# Patient Record
Sex: Female | Born: 1980
Health system: Southern US, Community
[De-identification: ages and names within clinical notes are randomized; demographics above are authoritative.]

## PROBLEM LIST (undated history)

## (undated) DIAGNOSIS — J3089 Other allergic rhinitis: Secondary | ICD-10-CM

## (undated) DIAGNOSIS — Q760 Spina bifida occulta: Secondary | ICD-10-CM

## (undated) DIAGNOSIS — T7840XA Allergy, unspecified, initial encounter: Secondary | ICD-10-CM

## (undated) DIAGNOSIS — Z8619 Personal history of other infectious and parasitic diseases: Secondary | ICD-10-CM

## (undated) DIAGNOSIS — J302 Other seasonal allergic rhinitis: Secondary | ICD-10-CM

## (undated) HISTORY — DX: Other allergic rhinitis: J30.89

## (undated) HISTORY — DX: Spina bifida occulta: Q76.0

## (undated) HISTORY — DX: Personal history of other infectious and parasitic diseases: Z86.19

## (undated) HISTORY — DX: Allergy, unspecified, initial encounter: T78.40XA

## (undated) HISTORY — DX: Other seasonal allergic rhinitis: J30.2

---

## 2000-09-16 HISTORY — PX: SPINAL FIXATION SURGERY: SHX1055

## 2006-07-31 ENCOUNTER — Other Ambulatory Visit: Admission: RE | Admit: 2006-07-31 | Discharge: 2006-07-31 | Payer: Self-pay | Admitting: Obstetrics and Gynecology

## 2008-06-04 ENCOUNTER — Ambulatory Visit (HOSPITAL_COMMUNITY): Admission: RE | Admit: 2008-06-04 | Discharge: 2008-06-04 | Payer: Self-pay | Admitting: Neurology

## 2008-06-15 ENCOUNTER — Ambulatory Visit (HOSPITAL_COMMUNITY): Admission: RE | Admit: 2008-06-15 | Discharge: 2008-06-15 | Payer: Self-pay | Admitting: Neurology

## 2010-10-08 ENCOUNTER — Encounter: Payer: Self-pay | Admitting: Neurology

## 2011-02-14 ENCOUNTER — Other Ambulatory Visit (HOSPITAL_COMMUNITY): Payer: Self-pay | Admitting: Internal Medicine

## 2011-02-14 DIAGNOSIS — R55 Syncope and collapse: Secondary | ICD-10-CM

## 2011-02-28 ENCOUNTER — Ambulatory Visit (HOSPITAL_COMMUNITY): Payer: Self-pay

## 2011-02-28 ENCOUNTER — Encounter (HOSPITAL_COMMUNITY)
Admission: RE | Admit: 2011-02-28 | Discharge: 2011-02-28 | Disposition: A | Discharge status: Home / Self Care | Payer: 59 | Source: Ambulatory Visit | Attending: Internal Medicine | Admitting: Internal Medicine

## 2011-02-28 DIAGNOSIS — R55 Syncope and collapse: Secondary | ICD-10-CM | POA: Insufficient documentation

## 2011-02-28 MED ORDER — TECHNETIUM TC 99M TETROFOSMIN IV KIT
10.0000 | PACK | Freq: Once | INTRAVENOUS | Status: AC | PRN
  Administered 2011-02-28: 10 via INTRAVENOUS

## 2011-02-28 MED ORDER — TECHNETIUM TC 99M TETROFOSMIN IV KIT
30.0000 | PACK | Freq: Once | INTRAVENOUS | Status: AC | PRN
  Administered 2011-02-28: 30 via INTRAVENOUS

## 2011-03-01 ENCOUNTER — Other Ambulatory Visit (HOSPITAL_COMMUNITY): Payer: Self-pay | Admitting: Family Medicine

## 2011-03-01 DIAGNOSIS — R55 Syncope and collapse: Secondary | ICD-10-CM

## 2011-03-05 ENCOUNTER — Inpatient Hospital Stay (HOSPITAL_COMMUNITY): Admission: RE | Admit: 2011-03-05 | Payer: Commercial Managed Care - PPO | Source: Ambulatory Visit

## 2011-03-05 ENCOUNTER — Other Ambulatory Visit (HOSPITAL_COMMUNITY): Payer: Commercial Managed Care - PPO

## 2011-03-05 ENCOUNTER — Ambulatory Visit (HOSPITAL_COMMUNITY)
Admission: RE | Admit: 2011-03-05 | Discharge: 2011-03-05 | Disposition: A | Discharge status: Home / Self Care | Payer: 59 | Source: Ambulatory Visit | Attending: Family Medicine | Admitting: Family Medicine

## 2011-03-05 DIAGNOSIS — R55 Syncope and collapse: Secondary | ICD-10-CM

## 2011-03-05 DIAGNOSIS — R079 Chest pain, unspecified: Secondary | ICD-10-CM | POA: Insufficient documentation

## 2011-03-05 MED ORDER — GADOBENATE DIMEGLUMINE 529 MG/ML IV SOLN
15.0000 mL | Freq: Once | INTRAVENOUS | Status: AC
  Administered 2011-03-05: 15 mL via INTRAVENOUS

## 2011-11-13 ENCOUNTER — Other Ambulatory Visit: Payer: 59

## 2011-11-13 ENCOUNTER — Encounter: Payer: Self-pay | Admitting: Internal Medicine

## 2011-11-13 ENCOUNTER — Ambulatory Visit (INDEPENDENT_AMBULATORY_CARE_PROVIDER_SITE_OTHER): Payer: 59 | Admitting: Internal Medicine

## 2011-11-13 DIAGNOSIS — J302 Other seasonal allergic rhinitis: Secondary | ICD-10-CM

## 2011-11-13 DIAGNOSIS — L509 Urticaria, unspecified: Secondary | ICD-10-CM

## 2011-11-13 DIAGNOSIS — J309 Allergic rhinitis, unspecified: Secondary | ICD-10-CM

## 2011-11-13 MED ORDER — EPINEPHRINE 0.3 MG/0.3ML IJ DEVI: 0.3000 mg | Freq: Once | INTRAMUSCULAR | Status: AC

## 2011-11-13 NOTE — Patient Instructions (Addendum)
Order- lab- food allergy profile, seafood panel    Done      Claritin - now, done  Script Auvi-Q 0.3 mg epinephrine injector  Daily antihistamine- Zyrtec, Allegra or Claritin  Benadryl as needed  Add an antihistamine based acid blocker for broader coverage-  Pepcid, Zantac or Tagamet  Foods to avoid or watch closely- shellfish, green pea, peanut, soybean/ soy products

## 2011-11-13 NOTE — Progress Notes (Signed)
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 7 or 8 years ago she does hold a cat and developed hives on her face. On February 11 she was eating sushi. During dinner she developed urticaria on her face treated with Benadryl. Hives resolved overnight. 3 days later she leaned to her face into some lilies given for her birthday and her cheeks swoll. History of perennial rhinitis with sneezing nasal drainage and I eructation. Allergy vaccine helped years ago while living in [place]. She usually takes Zyrtec every day, and occasional Flonase. Sometimes notices chest tightness while running but never diagnosed with asthma. Strong odors make tight in her chest. Pressure of elastic bands on clothing causes local hives and itching. No history of unusual reaction to insect stings or aspirin. She has 2 dogs. Married. Mother has asthma.  Allergy skin test: Strong reactions particularly to grass pollens, tree pollens, cat. Moderate to mild reaction to shellfish, green pea, peanut, soybean.  ROS-see HPI Constitutional:   No-   weight loss, night sweats, fevers, chills, fatigue, lassitude. HEENT:   No-  headaches, difficulty swallowing, tooth/dental problems, sore throat,       +  sneezing, itching, ear ache, nasal congestion, post nasal drip,  CV:  No-   chest pain, orthopnea, PND, swelling in lower extremities, anasarca,  dizziness, palpitations Resp: No-   shortness of breath with exertion or at rest.              No-   productive cough,  No non-productive cough,  No- coughing up of blood.              No-   change in color of mucus.  No- wheezing.   Skin: No-   rash or lesions. GI:  No-   heartburn, indigestion,  GU:  MS:  No-   joint pain or swelling.    No- back pain. Neuro-     nothing unusual Psych:  No- change in mood or affect. No depression or anxiety.  No memory loss.  OBJ- Physical Exam General- Alert, Oriented, Affect-appropriate, Distress-  none acute Skin- rash-none, lesions- none, excoriation- none Lymphadenopathy- none Head- atraumatic            Eyes- Gross vision intact, PERRLA, conjunctivae and secretions clear            Ears- Hearing, canals-normal            Nose- mild turbinate edema/ mucus, no-Septal dev,  polyps, erosion, perforation             Throat- Mallampati II , mucosa clear , drainage- none, tonsils- 3+ Neck- flexible , trachea midline, no stridor , thyroid nl, carotid no bruit Chest - symmetrical excursion , unlabored           Heart/CV- RRR , no murmur , no gallop  , no rub, nl s1 s2                           - JVD- none , edema- none,            Lung- clear to P&A, wheeze- none, cough- none , dullness-none, rub- none           Chest wall-  Abd- tender-no, distended-no, bowel sounds-present, HSM- no Br/ Gen/ Rectal- Not done, not indicated Extrem- cyanosis- none, clubbing, none, atrophy- none, strength- nl. Surgical scar lumbar spine Neuro- grossly intact to observation

## 2011-11-14 LAB — ALLERGEN FOOD PROFILE SPECIFIC IGE
Fish Cod: <0.1 kU/L (ref <0.35)
IgE (Immunoglobulin E), Serum: 72.2 IU/mL (ref 0.0–180.0)
Milk IgE: 0.11 kU/L (ref <0.35)
Peanut IgE: <0.1 kU/L (ref <0.35)
Soybean IgE: <0.1 kU/L (ref <0.35)
Tuna IgE: <0.1 kU/L (ref <0.35)
Wheat IgE: 0.39 kU/L — ABNORMAL HIGH (ref <0.35)

## 2011-11-15 LAB — ALLERGEN SEAFOOD PANEL
Scallop IgE: <0.1 kU/L (ref <0.35)
Tuna IgE: <0.1 kU/L (ref <0.35)

## 2011-11-17 ENCOUNTER — Encounter: Payer: Self-pay | Admitting: Internal Medicine

## 2011-11-17 DIAGNOSIS — J302 Other seasonal allergic rhinitis: Secondary | ICD-10-CM

## 2011-11-17 DIAGNOSIS — L509 Urticaria, unspecified: Secondary | ICD-10-CM | POA: Insufficient documentation

## 2011-11-17 HISTORY — DX: Other seasonal allergic rhinitis: J30.2

## 2011-11-17 NOTE — Assessment & Plan Note (Signed)
Antihistamines have been sufficient. We can add a nasal steroid spray if necessary. She is not looking for allergy vaccine at this time.

## 2011-11-17 NOTE — Assessment & Plan Note (Signed)
She understands to avoid foods that definite cause symptoms and to watch closely for the foods on this list. We discussed the Friday of triggers associated with urticaria and the lack of specificity. She will do well to continue maintenance antihistamine as discussed, using type I and type II agents.

## 2011-11-25 ENCOUNTER — Encounter: Payer: Self-pay | Admitting: Internal Medicine

## 2011-12-19 NOTE — Progress Notes (Signed)
Quick Note:  LMTCB ______ 

## 2012-05-17 LAB — HM PAP SMEAR: HM Pap smear: NORMAL

## 2013-03-09 ENCOUNTER — Ambulatory Visit (INDEPENDENT_AMBULATORY_CARE_PROVIDER_SITE_OTHER): Payer: 59 | Admitting: Internal Medicine

## 2013-03-09 ENCOUNTER — Encounter: Payer: Self-pay | Admitting: Internal Medicine

## 2013-03-09 ENCOUNTER — Other Ambulatory Visit (INDEPENDENT_AMBULATORY_CARE_PROVIDER_SITE_OTHER): Payer: 59

## 2013-03-09 VITALS — BP 100/62 | HR 60 | Temp 98.5°F | Ht 61.75 in | Wt 137.0 lb

## 2013-03-09 DIAGNOSIS — Z0000 Encounter for general adult medical examination without abnormal findings: Secondary | ICD-10-CM

## 2013-03-09 LAB — BASIC METABOLIC PANEL
BUN: 9 mg/dL (ref 6–23)
Calcium: 8.9 mg/dL (ref 8.4–10.5)
GFR: 134.19 mL/min (ref >60.00)
Potassium: 3.9 mEq/L (ref 3.5–5.1)
Sodium: 138 mEq/L (ref 135–145)

## 2013-03-09 LAB — CBC WITH DIFFERENTIAL/PLATELET
Basophils Absolute: 0 10*3/uL (ref 0.0–0.1)
Eosinophils Relative: 1.3 % (ref 0.0–5.0)
HCT: 41.4 % (ref 36.0–46.0)
Lymphocytes Relative: 24.6 % (ref 12.0–46.0)
Lymphs Abs: 2.2 10*3/uL (ref 0.7–4.0)
Monocytes Relative: 7.2 % (ref 3.0–12.0)
Platelets: 300 10*3/uL (ref 150.0–400.0)
RDW: 12.1 % (ref 11.5–14.6)
WBC: 8.8 10*3/uL (ref 4.5–10.5)

## 2013-03-09 LAB — URINALYSIS, ROUTINE W REFLEX MICROSCOPIC
Bilirubin Urine: NEGATIVE
Leukocytes, UA: NEGATIVE
Nitrite: NEGATIVE
RBC / HPF: NONE SEEN (ref 0–?)
Specific Gravity, Urine: 1.015 (ref 1.000–1.030)
Total Protein, Urine: NEGATIVE
WBC, UA: NONE SEEN (ref 0–?)
pH: 6.5 (ref 5.0–8.0)

## 2013-03-09 LAB — LIPID PANEL
Cholesterol: 197 mg/dL (ref 0–200)
HDL: 58.2 mg/dL (ref >39.00)
VLDL: 18.2 mg/dL (ref 0.0–40.0)

## 2013-03-09 LAB — HEPATIC FUNCTION PANEL
ALT: 12 U/L (ref 0–35)
AST: 15 U/L (ref 0–37)
Bilirubin, Direct: 0.1 mg/dL (ref 0.0–0.3)
Total Protein: 7.7 g/dL (ref 6.0–8.3)

## 2013-03-09 LAB — TSH: TSH: 2.43 u[IU]/mL (ref 0.35–5.50)

## 2013-03-09 MED ORDER — FOLIC ACID 800 MCG PO TABS: 400.0000 ug | ORAL_TABLET | Freq: Every day | ORAL | Status: DC

## 2013-03-09 NOTE — Patient Instructions (Signed)
It was good to see you today. We have reviewed your prior records including labs and tests today Health Maintenance reviewed - all recommended immunizations and age-appropriate screenings are up-to-date. Medications reviewed and updated, no changes recommended at this time. Test(s) ordered today. Your results will be released to MyChart (or called to you) after review, usually within 72hours after test completion. If any changes need to be made, you will be notified at that same time. Please schedule followup in 1-2 years, call sooner if problems.  Health Maintenance, Females A healthy lifestyle and preventative care can promote health and wellness.  Maintain regular health, dental, and eye exams.  Eat a healthy diet. Foods like vegetables, fruits, whole grains, low-fat dairy products, and lean protein foods contain the nutrients you need without too many calories. Decrease your intake of foods high in solid fats, added sugars, and salt. Get information about a proper diet from your caregiver, if necessary.  Regular physical exercise is one of the most important things you can do for your health. Most adults should get at least 150 minutes of moderate-intensity exercise (any activity that increases your heart rate and causes you to sweat) each week. In addition, most adults need muscle-strengthening exercises on 2 or more days a week.   Maintain a healthy weight. The body mass index (BMI) is a screening tool to identify possible weight problems. It provides an estimate of body fat based on height and weight. Your caregiver can help determine your BMI, and can help you achieve or maintain a healthy weight. For adults 20 years and older:  A BMI below 18.5 is considered underweight.  A BMI of 18.5 to 24.9 is normal.  A BMI of 25 to 29.9 is considered overweight.  A BMI of 30 and above is considered obese.  Maintain normal blood lipids and cholesterol by exercising and minimizing your intake  of saturated fat. Eat a balanced diet with plenty of fruits and vegetables. Blood tests for lipids and cholesterol should begin at age 28 and be repeated every 5 years. If your lipid or cholesterol levels are high, you are over 50, or you are a high risk for heart disease, you may need your cholesterol levels checked more frequently.Ongoing high lipid and cholesterol levels should be treated with medicines if diet and exercise are not effective.  If you smoke, find out from your caregiver how to quit. If you do not use tobacco, do not start.  If you are pregnant, do not drink alcohol. If you are breastfeeding, be very cautious about drinking alcohol. If you are not pregnant and choose to drink alcohol, do not exceed 1 drink per day. One drink is considered to be 12 ounces (355 mL) of beer, 5 ounces (148 mL) of wine, or 1.5 ounces (44 mL) of liquor.  Avoid use of street drugs. Do not share needles with anyone. Ask for help if you need support or instructions about stopping the use of drugs.  High blood pressure causes heart disease and increases the risk of stroke. Blood pressure should be checked at least every 1 to 2 years. Ongoing high blood pressure should be treated with medicines, if weight loss and exercise are not effective.  If you are 43 to 34 years old, ask your caregiver if you should take aspirin to prevent strokes.  Diabetes screening involves taking a blood sample to check your fasting blood sugar level. This should be done once every 3 years, after age 63, if you are  within normal weight and without risk factors for diabetes. Testing should be considered at a younger age or be carried out more frequently if you are overweight and have at least 1 risk factor for diabetes.  Breast cancer screening is essential preventative care for women. You should practice "breast self-awareness." This means understanding the normal appearance and feel of your breasts and may include breast  self-examination. Any changes detected, no matter how small, should be reported to a caregiver. Women in their 50s and 30s should have a clinical breast exam (CBE) by a caregiver as part of a regular health exam every 1 to 3 years. After age 46, women should have a CBE every year. Starting at age 39, women should consider having a mammogram (breast X-ray) every year. Women who have a family history of breast cancer should talk to their caregiver about genetic screening. Women at a high risk of breast cancer should talk to their caregiver about having an MRI and a mammogram every year.  The Pap test is a screening test for cervical cancer. Women should have a Pap test starting at age 73. Between ages 13 and 17, Pap tests should be repeated every 2 years. Beginning at age 22, you should have a Pap test every 3 years as long as the past 3 Pap tests have been normal. If you had a hysterectomy for a problem that was not cancer or a condition that could lead to cancer, then you no longer need Pap tests. If you are between ages 48 and 51, and you have had normal Pap tests going back 10 years, you no longer need Pap tests. If you have had past treatment for cervical cancer or a condition that could lead to cancer, you need Pap tests and screening for cancer for at least 20 years after your treatment. If Pap tests have been discontinued, risk factors (such as a new sexual partner) need to be reassessed to determine if screening should be resumed. Some women have medical problems that increase the chance of getting cervical cancer. In these cases, your caregiver may recommend more frequent screening and Pap tests.  The human papillomavirus (HPV) test is an additional test that may be used for cervical cancer screening. The HPV test looks for the virus that can cause the cell changes on the cervix. The cells collected during the Pap test can be tested for HPV. The HPV test could be used to screen women aged 84 years and  older, and should be used in women of any age who have unclear Pap test results. After the age of 22, women should have HPV testing at the same frequency as a Pap test.  Colorectal cancer can be detected and often prevented. Most routine colorectal cancer screening begins at the age of 62 and continues through age 73. However, your caregiver may recommend screening at an earlier age if you have risk factors for colon cancer. On a yearly basis, your caregiver may provide home test kits to check for hidden blood in the stool. Use of a small camera at the end of a tube, to directly examine the colon (sigmoidoscopy or colonoscopy), can detect the earliest forms of colorectal cancer. Talk to your caregiver about this at age 10, when routine screening begins. Direct examination of the colon should be repeated every 5 to 10 years through age 47, unless early forms of pre-cancerous polyps or small growths are found.  Hepatitis C blood testing is recommended for all people born  from 1945 through 1965 and any individual with known risks for hepatitis C.  Practice safe sex. Use condoms and avoid high-risk sexual practices to reduce the spread of sexually transmitted infections (STIs). Sexually active women aged 95 and younger should be checked for Chlamydia, which is a common sexually transmitted infection. Older women with new or multiple partners should also be tested for Chlamydia. Testing for other STIs is recommended if you are sexually active and at increased risk.  Osteoporosis is a disease in which the bones lose minerals and strength with aging. This can result in serious bone fractures. The risk of osteoporosis can be identified using a bone density scan. Women ages 27 and over and women at risk for fractures or osteoporosis should discuss screening with their caregivers. Ask your caregiver whether you should be taking a calcium supplement or vitamin D to reduce the rate of osteoporosis.  Menopause can be  associated with physical symptoms and risks. Hormone replacement therapy is available to decrease symptoms and risks. You should talk to your caregiver about whether hormone replacement therapy is right for you.  Use sunscreen with a sun protection factor (SPF) of 30 or greater. Apply sunscreen liberally and repeatedly throughout the day. You should seek shade when your shadow is shorter than you. Protect yourself by wearing long sleeves, pants, a wide-brimmed hat, and sunglasses year round, whenever you are outdoors.  Notify your caregiver of new moles or changes in moles, especially if there is a change in shape or color. Also notify your caregiver if a mole is larger than the size of a pencil eraser.  Stay current with your immunizations. Document Released: 03/18/2011 Document Revised: 11/25/2011 Document Reviewed: 03/18/2011 [facility] Patient Information 2014 [place], [place].

## 2013-03-09 NOTE — Progress Notes (Signed)
  Subjective:    Patient ID: Breanna Gonzalez, female    DOB: February 20, 1981, 32 y.o.   MRN: [id number]  HPI  patient is here today for annual physical. Patient feels well and has no complaints.  Past Medical History  Diagnosis Date  . Spina bifida occulta   . ALLERGIC RHINITIS   . Urticaria   . Perennial allergic rhinitis with seasonal variation 11/17/2011    Allergy skin testing positive 11/13/11   . History of chicken pox    Family History  Problem Relation Age of Onset  . Diabetes Mother   . Hypertension Mother   . Diabetes Father   . Asthma Mother     undx  . Diabetes Maternal Grandmother   . Depression Mother    History  Substance Use Topics  . Smoking status: Never Smoker   . Smokeless tobacco: Not on file  . Alcohol Use: Yes     Comment: occasional     Review of Systems Constitutional: Negative for fever or weight change.  Respiratory: Negative for cough and shortness of breath.   Cardiovascular: Negative for chest pain or palpitations.  Gastrointestinal: Negative for abdominal pain, no bowel changes.  Musculoskeletal: Negative for gait problem or joint swelling.  Skin: Negative for rash.  Neurological: Negative for dizziness or headache.  No other specific complaints in a complete review of systems (except as listed in HPI above).     Objective:   Physical Exam BP 100/62  Pulse 60  Temp(Src) 98.5 F (36.9 C) (Oral)  Ht 5' 1.75" (1.568 m)  Wt 137 lb (62.143 kg)  BMI 25.28 kg/m2  SpO2 97% Wt Readings from Last 3 Encounters:  03/09/13 137 lb (62.143 kg)  11/13/11 137 lb (62.143 kg)   Constitutional: She appears well-developed and well-nourished. No distress.  HENT: Head: Normocephalic and atraumatic. Ears: B TMs ok, no erythema or effusion; Nose: Nose normal. Mouth/Throat: Oropharynx is clear and moist. No oropharyngeal exudate.  Eyes: Conjunctivae and EOM are normal. Pupils are equal, round, and reactive to light. No scleral icterus.  Neck: Normal range of  motion. Neck supple. No JVD present. No thyromegaly present.  Cardiovascular: Normal rate, regular rhythm and normal heart sounds.  No murmur heard. No BLE edema. Pulmonary/Chest: Effort normal and breath sounds normal. No respiratory distress. She has no wheezes.  Abdominal: Soft. Bowel sounds are normal. She exhibits no distension. There is no tenderness. no masses Musculoskeletal: Normal range of motion, no joint effusions. No gross deformities Neurological: She is alert and oriented to person, place, and time. No cranial nerve deficit. Coordination, balance, strength, speech and gait are normal.  Skin: "hairy" changes at low back at site of spina bifida repair/surgical scar -Skin is warm and dry. No rash noted. No erythema.  Psychiatric: She has a normal mood and affect. Her behavior is normal. Judgment and thought content normal.   No results found for this basename: WBC, HGB, HCT, PLT, GLUCOSE, CHOL, TRIG, HDL, LDLDIRECT, LDLCALC, ALT, AST, NA, K, CL, CREATININE, BUN, CO2, TSH, PSA, INR, GLUF, HGBA1C, MICROALBUR       Assessment & Plan:   CPX/v70.0 - Patient has been counseled on age-appropriate routine health concerns for screening and prevention. These are reviewed and up-to-date. Immunizations are up-to-date or declined. Labs and reviewed.

## 2013-07-22 ENCOUNTER — Other Ambulatory Visit: Payer: Self-pay

## 2015-12-14 DIAGNOSIS — Z6825 Body mass index (BMI) 25.0-25.9, adult: Secondary | ICD-10-CM | POA: Diagnosis not present

## 2015-12-14 DIAGNOSIS — Z01419 Encounter for gynecological examination (general) (routine) without abnormal findings: Secondary | ICD-10-CM | POA: Diagnosis not present

## 2015-12-14 DIAGNOSIS — Z3009 Encounter for other general counseling and advice on contraception: Secondary | ICD-10-CM | POA: Diagnosis not present

## 2015-12-18 ENCOUNTER — Ambulatory Visit (INDEPENDENT_AMBULATORY_CARE_PROVIDER_SITE_OTHER): Payer: 59 | Admitting: Internal Medicine

## 2015-12-18 ENCOUNTER — Encounter: Payer: Self-pay | Admitting: Internal Medicine

## 2015-12-18 VITALS — BP 124/84 | HR 79 | Temp 98.7°F | Resp 16 | Wt 140.0 lb

## 2015-12-18 DIAGNOSIS — Z0000 Encounter for general adult medical examination without abnormal findings: Secondary | ICD-10-CM | POA: Diagnosis not present

## 2015-12-18 DIAGNOSIS — J302 Other seasonal allergic rhinitis: Secondary | ICD-10-CM

## 2015-12-18 DIAGNOSIS — Z23 Encounter for immunization: Secondary | ICD-10-CM | POA: Diagnosis not present

## 2015-12-18 DIAGNOSIS — J3089 Other allergic rhinitis: Secondary | ICD-10-CM

## 2015-12-18 NOTE — Progress Notes (Signed)
Pre visit review using our clinic review tool, if applicable. No additional management support is needed unless otherwise documented below in the visit note. 

## 2015-12-18 NOTE — Assessment & Plan Note (Signed)
Controlled with otc allergy medications

## 2015-12-18 NOTE — Progress Notes (Signed)
Subjective:    Patient ID: Breanna Gonzalez, female    DOB: [birth year], 35 y.o.   MRN: [id number]  HPI She is here to establish with a new pcp.  She is here for a physical exam.   She has no concerns and there have not been any changes in her health since she was here last.    Medications and allergies reviewed with patient and updated if appropriate.  Patient Active Problem List   Diagnosis Date Noted  . 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 Perennial allergic rhinitis with seasonal variation 11/17/2011    Current Outpatient Prescriptions on File Prior to Visit  Medication Sig Dispense Refill  . cetirizine (ZYRTEC) 10 MG tablet Take 10 mg by mouth daily as needed.    . Triamcinolone Acetonide (NASACORT ALLERGY 24HR) 55 MCG/ACT AERO Place into the nose daily.     No current facility-administered medications on file prior to visit.    Past Medical History  Diagnosis Date  . Spina bifida occulta   . ALLERGIC RHINITIS   . Urticaria   . Perennial allergic rhinitis with seasonal variation 11/17/2011    Allergy skin testing positive 11/13/11   . History of chicken pox     Past Surgical History  Procedure Laterality Date  . Spinal fixation surgery  2002    [facility] for spina bifida repair    Social History   Social History  . Marital Status: Married    Spouse Name: N/A  . Number of Children: 0  . Years of Education: N/A   Occupational History  . NP [employer]    Social History Main Topics  . Smoking status: Never Smoker   . Smokeless tobacco: Never Used  . Alcohol Use: 0.0 oz/week    0 Standard drinks or equivalent per week     Comment: occasional   . Drug Use: No  . Sexual Activity: Not Asked   Other Topics Concern  . None   Social History Narrative   Exercises regularly    Family History  Problem Relation Age of Onset  . Diabetes Mother   . Hypertension Mother   . Diabetes Father   . Asthma Mother     undx  . Diabetes Maternal  Grandmother   . Depression Mother     Review of Systems  Constitutional: Negative for fever, chills, appetite change, fatigue and unexpected weight change.  Eyes: Negative for visual disturbance.  Respiratory: Negative for cough, shortness of breath and wheezing.   Cardiovascular: Negative for chest pain, palpitations and leg swelling.  Gastrointestinal: Negative for nausea, abdominal pain, diarrhea, constipation and blood in stool.       No gerd  Genitourinary: Negative for dysuria and hematuria.  Musculoskeletal: Negative for myalgias, back pain and arthralgias.  Skin: Negative for color change and rash.  Neurological: Negative for dizziness, light-headedness and headaches.  Psychiatric/Behavioral: Negative for sleep disturbance and dysphoric mood. The patient is not nervous/anxious.        Objective:   Filed Vitals:   12/18/15 1554  BP: 124/84  Pulse: 79  Temp: 98.7 F (37.1 C)  Resp: 16   Filed Weights   12/18/15 1554  Weight: 140 lb (63.504 kg)   Body mass index is 25.83 kg/(m^2).   Physical Exam Constitutional: She appears well-developed and well-nourished. No distress.  HENT:  Head: Normocephalic and atraumatic.  Right Ear: External ear normal. Normal ear canal and TM Left Ear: External ear normal.  Normal ear canal  and TM Mouth/Throat: Oropharynx is clear and moist.  Eyes: Conjunctivae and EOM are normal.  Neck: Neck supple. No tracheal deviation present. No thyromegaly present.  No carotid bruit  Cardiovascular: Normal rate, regular rhythm and normal heart sounds.   No murmur heard.  No edema. Pulmonary/Chest: Effort normal and breath sounds normal. No respiratory distress. She has no wheezes. She has no rales.  Breast: deferred to Gyn Abdominal: Soft. She exhibits no distension. There is no tenderness.  Lymphadenopathy: She has no cervical adenopathy.  Skin: Skin is warm and dry. She is not diaphoretic.  Psychiatric: She has a normal mood and affect. Her  behavior is normal.         Assessment & Plan:   Physical exam: Screening blood work ordered Immunizations  tdap today Gyn  Up to date Exercise - exercises regulalry, runs Weight - will work on weight loss Skin  - no concerns Substance abuse - no evidence of substance abuse  See Problem List for Assessment and Plan of chronic medical problems.  Follow up annually

## 2015-12-18 NOTE — Patient Instructions (Addendum)
Test(s) ordered today. Your results will be released to [facility] (or called to you) after review, usually within 72hours after test completion. If any changes need to be made, you will be notified at that same time.  All other Health Maintenance issues reviewed.   All recommended immunizations and age-appropriate screenings are up-to-date or discussed.  tdap administered today.   Medications reviewed and updated.  No changes recommended at this time.   Health Maintenance, Female Adopting a healthy lifestyle and getting preventive care can go a long way to promote health and wellness. Talk with your health care provider about what schedule of regular examinations is right for you. This is a good chance for you to check in with your provider about disease prevention and staying healthy. In between checkups, there are plenty of things you can do on your own. Experts have done a lot of research about which lifestyle changes and preventive measures are most likely to keep you healthy. Ask your health care provider for more information. WEIGHT AND DIET  Eat a healthy diet  Be sure to include plenty of vegetables, fruits, low-fat dairy products, and lean protein.  Do not eat a lot of foods high in solid fats, added sugars, or salt.  Get regular exercise. This is one of the most important things you can do for your health.  Most adults should exercise for at least 150 minutes each week. The exercise should increase your heart rate and make you sweat (moderate-intensity exercise).  Most adults should also do strengthening exercises at least twice a week. This is in addition to the moderate-intensity exercise.  Maintain a healthy weight  Body mass index (BMI) is a measurement that can be used to identify possible weight problems. It estimates body fat based on height and weight. Your health care provider can help determine your BMI and help you achieve or maintain a healthy weight.  For females  29 years of age and older:   A BMI below 18.5 is considered underweight.  A BMI of 18.5 to 24.9 is normal.  A BMI of 25 to 29.9 is considered overweight.  A BMI of 30 and above is considered obese.  Watch levels of cholesterol and blood lipids  You should start having your blood tested for lipids and cholesterol at 37 years of age, then have this test every 5 years.  You may need to have your cholesterol levels checked more often if:  Your lipid or cholesterol levels are high.  You are older than 37 years of age.  You are at high risk for heart disease.  CANCER SCREENING   Lung Cancer  Lung cancer screening is recommended for adults 40-67 years old who are at high risk for lung cancer because of a history of smoking.  A yearly low-dose CT scan of the lungs is recommended for people who:  Currently smoke.  Have quit within the past 15 years.  Have at least a 30-pack-year history of smoking. A pack year is smoking an average of one pack of cigarettes a day for 1 year.  Yearly screening should continue until it has been 15 years since you quit.  Yearly screening should stop if you develop a health problem that would prevent you from having lung cancer treatment.  Breast Cancer  Practice breast self-awareness. This means understanding how your breasts normally appear and feel.  It also means doing regular breast self-exams. Let your health care provider know about any changes, no matter how small.  If you are in your 20s or 30s, you should have a clinical breast exam (CBE) by a health care provider every 1-3 years as part of a regular health exam.  If you are 65 or older, have a CBE every year. Also consider having a breast X-ray (mammogram) every year.  If you have a family history of breast cancer, talk to your health care provider about genetic screening.  If you are at high risk for breast cancer, talk to your health care provider about having an MRI and a  mammogram every year.  Breast cancer gene (BRCA) assessment is recommended for women who have family members with BRCA-related cancers. BRCA-related cancers include:  Breast.  Ovarian.  Tubal.  Peritoneal cancers.  Results of the assessment will determine the need for genetic counseling and BRCA1 and BRCA2 testing. Cervical Cancer Your health care provider may recommend that you be screened regularly for cancer of the pelvic organs (ovaries, uterus, and vagina). This screening involves a pelvic examination, including checking for microscopic changes to the surface of your cervix (Pap test). You may be encouraged to have this screening done every 3 years, beginning at age 93.  For women ages 62-65, health care providers may recommend pelvic exams and Pap testing every 3 years, or they may recommend the Pap and pelvic exam, combined with testing for human papilloma virus (HPV), every 5 years. Some types of HPV increase your risk of cervical cancer. Testing for HPV may also be done on women of any age with unclear Pap test results.  Other health care providers may not recommend any screening for nonpregnant women who are considered low risk for pelvic cancer and who do not have symptoms. Ask your health care provider if a screening pelvic exam is right for you.  If you have had past treatment for cervical cancer or a condition that could lead to cancer, you need Pap tests and screening for cancer for at least 20 years after your treatment. If Pap tests have been discontinued, your risk factors (such as having a new sexual partner) need to be reassessed to determine if screening should resume. Some women have medical problems that increase the chance of getting cervical cancer. In these cases, your health care provider may recommend more frequent screening and Pap tests. Colorectal Cancer  This type of cancer can be detected and often prevented.  Routine colorectal cancer screening usually  begins at 37 years of age and continues through 37 years of age.  Your health care provider may recommend screening at an earlier age if you have risk factors for colon cancer.  Your health care provider may also recommend using home test kits to check for hidden blood in the stool.  A small camera at the end of a tube can be used to examine your colon directly (sigmoidoscopy or colonoscopy). This is done to check for the earliest forms of colorectal cancer.  Routine screening usually begins at age 22.  Direct examination of the colon should be repeated every 5-10 years through 37 years of age. However, you may need to be screened more often if early forms of precancerous polyps or small growths are found. Skin Cancer  Check your skin from head to toe regularly.  Tell your health care provider about any new moles or changes in moles, especially if there is a change in a mole's shape or color.  Also tell your health care provider if you have a mole that is larger than the  size of a pencil eraser.  Always use sunscreen. Apply sunscreen liberally and repeatedly throughout the day.  Protect yourself by wearing long sleeves, pants, a wide-brimmed hat, and sunglasses whenever you are outside. HEART DISEASE, DIABETES, AND HIGH BLOOD PRESSURE   High blood pressure causes heart disease and increases the risk of stroke. High blood pressure is more likely to develop in:  People who have blood pressure in the high end of the normal range (130-139/85-89 mm Hg).  People who are overweight or obese.  People who are African American.  If you are 58-73 years of age, have your blood pressure checked every 3-5 years. If you are 72 years of age or older, have your blood pressure checked every year. You should have your blood pressure measured twice--once when you are at a hospital or clinic, and once when you are not at a hospital or clinic. Record the average of the two measurements. To check your blood  pressure when you are not at a hospital or clinic, you can use:  An automated blood pressure machine at a pharmacy.  A home blood pressure monitor.  If you are between 66 years and 50 years old, ask your health care provider if you should take aspirin to prevent strokes.  Have regular diabetes screenings. This involves taking a blood sample to check your fasting blood sugar level.  If you are at a normal weight and have a low risk for diabetes, have this test once every three years after 37 years of age.  If you are overweight and have a high risk for diabetes, consider being tested at a younger age or more often. PREVENTING INFECTION  Hepatitis B  If you have a higher risk for hepatitis B, you should be screened for this virus. You are considered at high risk for hepatitis B if:  You were born in a country where hepatitis B is common. Ask your health care provider which countries are considered high risk.  Your parents were born in a high-risk country, and you have not been immunized against hepatitis B (hepatitis B vaccine).  You have HIV or AIDS.  You use needles to inject street drugs.  You live with someone who has hepatitis B.  You have had sex with someone who has hepatitis B.  You get hemodialysis treatment.  You take certain medicines for conditions, including cancer, organ transplantation, and autoimmune conditions. Hepatitis C  Blood testing is recommended for:  Everyone born from [birth year] through 1967.  Anyone with known risk factors for hepatitis C. Sexually transmitted infections (STIs)  You should be screened for sexually transmitted infections (STIs) including gonorrhea and chlamydia if:  You are sexually active and are younger than 37 years of age.  You are older than 37 years of age and your health care provider tells you that you are at risk for this type of infection.  Your sexual activity has changed since you were last screened and you are at an  increased risk for chlamydia or gonorrhea. Ask your health care provider if you are at risk.  If you do not have HIV, but are at risk, it may be recommended that you take a prescription medicine daily to prevent HIV infection. This is called pre-exposure prophylaxis (PrEP). You are considered at risk if:  You are sexually active and do not regularly use condoms or know the HIV status of your partner(s).  You take drugs by injection.  You are sexually active with a partner who  has HIV. Talk with your health care provider about whether you are at high risk of being infected with HIV. If you choose to begin PrEP, you should first be tested for HIV. You should then be tested every 3 months for as long as you are taking PrEP.  PREGNANCY   If you are premenopausal and you may become pregnant, ask your health care provider about preconception counseling.  If you may become pregnant, take 400 to 800 micrograms (mcg) of folic acid every day.  If you want to prevent pregnancy, talk to your health care provider about birth control (contraception). OSTEOPOROSIS AND MENOPAUSE   Osteoporosis is a disease in which the bones lose minerals and strength with aging. This can result in serious bone fractures. Your risk for osteoporosis can be identified using a bone density scan.  If you are 101 years of age or older, or if you are at risk for osteoporosis and fractures, ask your health care provider if you should be screened.  Ask your health care provider whether you should take a calcium or vitamin D supplement to lower your risk for osteoporosis.  Menopause may have certain physical symptoms and risks.  Hormone replacement therapy may reduce some of these symptoms and risks. Talk to your health care provider about whether hormone replacement therapy is right for you.  HOME CARE INSTRUCTIONS   Schedule regular health, dental, and eye exams.  Stay current with your immunizations.   Do not use any  tobacco products including cigarettes, chewing tobacco, or electronic cigarettes.  If you are pregnant, do not drink alcohol.  If you are breastfeeding, limit how much and how often you drink alcohol.  Limit alcohol intake to no more than 1 drink per day for nonpregnant women. One drink equals 12 ounces of beer, 5 ounces of wine, or 1 ounces of hard liquor.  Do not use street drugs.  Do not share needles.  Ask your health care provider for help if you need support or information about quitting drugs.  Tell your health care provider if you often feel depressed.  Tell your health care provider if you have ever been abused or do not feel safe at home.   This information is not intended to replace advice given to you by your health care provider. Make sure you discuss any questions you have with your health care provider.   Document Released: 03/18/2011 Document Revised: 09/23/2014 Document Reviewed: 08/04/2013 [employer] Interactive Patient Education [employer].

## 2016-01-04 ENCOUNTER — Encounter: Payer: Self-pay | Admitting: Internal Medicine

## 2016-01-04 ENCOUNTER — Other Ambulatory Visit (INDEPENDENT_AMBULATORY_CARE_PROVIDER_SITE_OTHER): Payer: 59

## 2016-01-04 DIAGNOSIS — Z0000 Encounter for general adult medical examination without abnormal findings: Secondary | ICD-10-CM

## 2016-01-04 LAB — COMPREHENSIVE METABOLIC PANEL
ALT: 13 U/L (ref 0–35)
AST: 14 U/L (ref 0–37)
Albumin: 4.2 g/dL (ref 3.5–5.2)
Alkaline Phosphatase: 65 U/L (ref 39–117)
BUN: 15 mg/dL (ref 6–23)
CHLORIDE: 103 meq/L (ref 96–112)
CO2: 29 meq/L (ref 19–32)
Calcium: 9.3 mg/dL (ref 8.4–10.5)
Creatinine, Ser: 0.76 mg/dL (ref 0.40–1.20)
GFR: 90.92 mL/min (ref >60.00)
GLUCOSE: 95 mg/dL (ref 70–99)
POTASSIUM: 4.1 meq/L (ref 3.5–5.1)
SODIUM: 139 meq/L (ref 135–145)
Total Bilirubin: 0.5 mg/dL (ref 0.2–1.2)
Total Protein: 7.3 g/dL (ref 6.0–8.3)

## 2016-01-04 LAB — CBC WITH DIFFERENTIAL/PLATELET
BASOS ABS: 0 10*3/uL (ref 0.0–0.1)
Basophils Relative: 0.6 % (ref 0.0–3.0)
EOS PCT: 1.9 % (ref 0.0–5.0)
Eosinophils Absolute: 0.1 10*3/uL (ref 0.0–0.7)
HCT: 42 % (ref 36.0–46.0)
Hemoglobin: 14.3 g/dL (ref 12.0–15.0)
LYMPHS ABS: 2.7 10*3/uL (ref 0.7–4.0)
Lymphocytes Relative: 37.4 % (ref 12.0–46.0)
MCHC: 34 g/dL (ref 30.0–36.0)
MCV: 87.7 fl (ref 78.0–100.0)
MONO ABS: 0.6 10*3/uL (ref 0.1–1.0)
MONOS PCT: 8.6 % (ref 3.0–12.0)
NEUTROS ABS: 3.8 10*3/uL (ref 1.4–7.7)
NEUTROS PCT: 51.5 % (ref 43.0–77.0)
PLATELETS: 314 10*3/uL (ref 150.0–400.0)
RBC: 4.78 Mil/uL (ref 3.87–5.11)
RDW: 12.3 % (ref 11.5–15.5)
WBC: 7.3 10*3/uL (ref 4.0–10.5)

## 2016-01-04 LAB — LIPID PANEL
CHOL/HDL RATIO: 3
Cholesterol: 205 mg/dL — ABNORMAL HIGH (ref 0–200)
HDL: 72.9 mg/dL (ref >39.00)
LDL CALC: 115 mg/dL — AB (ref 0–99)
NONHDL: 132.27
Triglycerides: 88 mg/dL (ref 0.0–149.0)
VLDL: 17.6 mg/dL (ref 0.0–40.0)

## 2016-01-04 LAB — TSH: TSH: 2.34 u[IU]/mL (ref 0.35–4.50)

## 2016-07-11 DIAGNOSIS — Z0100 Encounter for examination of eyes and vision without abnormal findings: Secondary | ICD-10-CM | POA: Diagnosis not present

## 2017-01-30 DIAGNOSIS — Z6825 Body mass index (BMI) 25.0-25.9, adult: Secondary | ICD-10-CM | POA: Diagnosis not present

## 2017-01-30 DIAGNOSIS — Z1231 Encounter for screening mammogram for malignant neoplasm of breast: Secondary | ICD-10-CM | POA: Diagnosis not present

## 2017-01-30 DIAGNOSIS — Z01419 Encounter for gynecological examination (general) (routine) without abnormal findings: Secondary | ICD-10-CM | POA: Diagnosis not present

## 2017-02-23 NOTE — Patient Instructions (Addendum)
Test(s) ordered today. Your results will be released to [facility] (or called to you) after review, usually within 72hours after test completion. If any changes need to be made, you will be notified at that same time.  All other Health Maintenance issues reviewed.   All recommended immunizations and age-appropriate screenings are up-to-date or discussed.  No immunizations administered today.   Medications reviewed and updated.  No changes recommended at this time.   Please followup in one year   Health Maintenance, Female Adopting a healthy lifestyle and getting preventive care can go a long way to promote health and wellness. Talk with your health care provider about what schedule of regular examinations is right for you. This is a good chance for you to check in with your provider about disease prevention and staying healthy. In between checkups, there are plenty of things you can do on your own. Experts have done a lot of research about which lifestyle changes and preventive measures are most likely to keep you healthy. Ask your health care provider for more information. Weight and diet Eat a healthy diet  Be sure to include plenty of vegetables, fruits, low-fat dairy products, and lean protein.  Do not eat a lot of foods high in solid fats, added sugars, or salt.  Get regular exercise. This is one of the most important things you can do for your health. ? Most adults should exercise for at least 150 minutes each week. The exercise should increase your heart rate and make you sweat (moderate-intensity exercise). ? Most adults should also do strengthening exercises at least twice a week. This is in addition to the moderate-intensity exercise.  Maintain a healthy weight  Body mass index (BMI) is a measurement that can be used to identify possible weight problems. It estimates body fat based on height and weight. Your health care provider can help determine your BMI and help you achieve or  maintain a healthy weight.  For females 64 years of age and older: ? A BMI below 18.5 is considered underweight. ? A BMI of 18.5 to 24.9 is normal. ? A BMI of 25 to 29.9 is considered overweight. ? A BMI of 30 and above is considered obese.  Watch levels of cholesterol and blood lipids  You should start having your blood tested for lipids and cholesterol at 38 years of age, then have this test every 5 years.  You may need to have your cholesterol levels checked more often if: ? Your lipid or cholesterol levels are high. ? You are older than 38 years of age. ? You are at high risk for heart disease.  Cancer screening Lung Cancer  Lung cancer screening is recommended for adults 72-7 years old who are at high risk for lung cancer because of a history of smoking.  A yearly low-dose CT scan of the lungs is recommended for people who: ? Currently smoke. ? Have quit within the past 15 years. ? Have at least a 30-pack-year history of smoking. A pack year is smoking an average of one pack of cigarettes a day for 1 year.  Yearly screening should continue until it has been 15 years since you quit.  Yearly screening should stop if you develop a health problem that would prevent you from having lung cancer treatment.  Breast Cancer  Practice breast self-awareness. This means understanding how your breasts normally appear and feel.  It also means doing regular breast self-exams. Let your health care provider know about any changes,  no matter how small.  If you are in your 20s or 30s, you should have a clinical breast exam (CBE) by a health care provider every 1-3 years as part of a regular health exam.  If you are 40 or older, have a CBE every year. Also consider having a breast X-ray (mammogram) every year.  If you have a family history of breast cancer, talk to your health care provider about genetic screening.  If you are at high risk for breast cancer, talk to your health care  provider about having an MRI and a mammogram every year.  Breast cancer gene (BRCA) assessment is recommended for women who have family members with BRCA-related cancers. BRCA-related cancers include: ? Breast. ? Ovarian. ? Tubal. ? Peritoneal cancers.  Results of the assessment will determine the need for genetic counseling and BRCA1 and BRCA2 testing.  Cervical Cancer Your health care provider may recommend that you be screened regularly for cancer of the pelvic organs (ovaries, uterus, and vagina). This screening involves a pelvic examination, including checking for microscopic changes to the surface of your cervix (Pap test). You may be encouraged to have this screening done every 3 years, beginning at age 21.  For women ages 30-65, health care providers may recommend pelvic exams and Pap testing every 3 years, or they may recommend the Pap and pelvic exam, combined with testing for human papilloma virus (HPV), every 5 years. Some types of HPV increase your risk of cervical cancer. Testing for HPV may also be done on women of any age with unclear Pap test results.  Other health care providers may not recommend any screening for nonpregnant women who are considered low risk for pelvic cancer and who do not have symptoms. Ask your health care provider if a screening pelvic exam is right for you.  If you have had past treatment for cervical cancer or a condition that could lead to cancer, you need Pap tests and screening for cancer for at least 20 years after your treatment. If Pap tests have been discontinued, your risk factors (such as having a new sexual partner) need to be reassessed to determine if screening should resume. Some women have medical problems that increase the chance of getting cervical cancer. In these cases, your health care provider may recommend more frequent screening and Pap tests.  Colorectal Cancer  This type of cancer can be detected and often prevented.  Routine  colorectal cancer screening usually begins at 38 years of age and continues through 38 years of age.  Your health care provider may recommend screening at an earlier age if you have risk factors for colon cancer.  Your health care provider may also recommend using home test kits to check for hidden blood in the stool.  A small camera at the end of a tube can be used to examine your colon directly (sigmoidoscopy or colonoscopy). This is done to check for the earliest forms of colorectal cancer.  Routine screening usually begins at age 50.  Direct examination of the colon should be repeated every 5-10 years through 38 years of age. However, you may need to be screened more often if early forms of precancerous polyps or small growths are found.  Skin Cancer  Check your skin from head to toe regularly.  Tell your health care provider about any new moles or changes in moles, especially if there is a change in a mole's shape or color.  Also tell your health care provider if you   have a mole that is larger than the size of a pencil eraser.  Always use sunscreen. Apply sunscreen liberally and repeatedly throughout the day.  Protect yourself by wearing long sleeves, pants, a wide-brimmed hat, and sunglasses whenever you are outside.  Heart disease, diabetes, and high blood pressure  High blood pressure causes heart disease and increases the risk of stroke. High blood pressure is more likely to develop in: ? People who have blood pressure in the high end of the normal range (130-139/85-89 mm Hg). ? People who are overweight or obese. ? People who are African American.  If you are 24-25 years of age, have your blood pressure checked every 3-5 years. If you are 2 years of age or older, have your blood pressure checked every year. You should have your blood pressure measured twice-once when you are at a hospital or clinic, and once when you are not at a hospital or clinic. Record the average of the  two measurements. To check your blood pressure when you are not at a hospital or clinic, you can use: ? An automated blood pressure machine at a pharmacy. ? A home blood pressure monitor.  If you are between 42 years and 59 years old, ask your health care provider if you should take aspirin to prevent strokes.  Have regular diabetes screenings. This involves taking a blood sample to check your fasting blood sugar level. ? If you are at a normal weight and have a low risk for diabetes, have this test once every three years after 38 years of age. ? If you are overweight and have a high risk for diabetes, consider being tested at a younger age or more often. Preventing infection Hepatitis B  If you have a higher risk for hepatitis B, you should be screened for this virus. You are considered at high risk for hepatitis B if: ? You were born in a country where hepatitis B is common. Ask your health care provider which countries are considered high risk. ? Your parents were born in a high-risk country, and you have not been immunized against hepatitis B (hepatitis B vaccine). ? You have HIV or AIDS. ? You use needles to inject street drugs. ? You live with someone who has hepatitis B. ? You have had sex with someone who has hepatitis B. ? You get hemodialysis treatment. ? You take certain medicines for conditions, including cancer, organ transplantation, and autoimmune conditions.  Hepatitis C  Blood testing is recommended for: ? Everyone born from [birth year] through 1967. ? Anyone with known risk factors for hepatitis C.  Sexually transmitted infections (STIs)  You should be screened for sexually transmitted infections (STIs) including gonorrhea and chlamydia if: ? You are sexually active and are younger than 38 years of age. ? You are older than 38 years of age and your health care provider tells you that you are at risk for this type of infection. ? Your sexual activity has changed since you  were last screened and you are at an increased risk for chlamydia or gonorrhea. Ask your health care provider if you are at risk.  If you do not have HIV, but are at risk, it may be recommended that you take a prescription medicine daily to prevent HIV infection. This is called pre-exposure prophylaxis (PrEP). You are considered at risk if: ? You are sexually active and do not regularly use condoms or know the HIV status of your partner(s). ? You take drugs by injection. ?  You are sexually active with a partner who has HIV.  Talk with your health care provider about whether you are at high risk of being infected with HIV. If you choose to begin PrEP, you should first be tested for HIV. You should then be tested every 3 months for as long as you are taking PrEP. Pregnancy  If you are premenopausal and you may become pregnant, ask your health care provider about preconception counseling.  If you may become pregnant, take 400 to 800 micrograms (mcg) of folic acid every day.  If you want to prevent pregnancy, talk to your health care provider about birth control (contraception). Osteoporosis and menopause  Osteoporosis is a disease in which the bones lose minerals and strength with aging. This can result in serious bone fractures. Your risk for osteoporosis can be identified using a bone density scan.  If you are 65 years of age or older, or if you are at risk for osteoporosis and fractures, ask your health care provider if you should be screened.  Ask your health care provider whether you should take a calcium or vitamin D supplement to lower your risk for osteoporosis.  Menopause may have certain physical symptoms and risks.  Hormone replacement therapy may reduce some of these symptoms and risks. Talk to your health care provider about whether hormone replacement therapy is right for you. Follow these instructions at home:  Schedule regular health, dental, and eye exams.  Stay current  with your immunizations.  Do not use any tobacco products including cigarettes, chewing tobacco, or electronic cigarettes.  If you are pregnant, do not drink alcohol.  If you are breastfeeding, limit how much and how often you drink alcohol.  Limit alcohol intake to no more than 1 drink per day for nonpregnant women. One drink equals 12 ounces of beer, 5 ounces of wine, or 1 ounces of hard liquor.  Do not use street drugs.  Do not share needles.  Ask your health care provider for help if you need support or information about quitting drugs.  Tell your health care provider if you often feel depressed.  Tell your health care provider if you have ever been abused or do not feel safe at home. This information is not intended to replace advice given to you by your health care provider. Make sure you discuss any questions you have with your health care provider. Document Released: 03/18/2011 Document Revised: 02/08/2016 Document Reviewed: 06/06/2015 Elsevier Interactive Patient Education  2018 Elsevier Inc.  

## 2017-02-23 NOTE — Progress Notes (Signed)
Subjective:    Patient ID: Breanna Gonzalez, female    DOB: [birth year], 36 y.o.   MRN: [id number]  HPI She is here for a physical exam.   She has no concerns.   She denies changes in health.    Medications and allergies reviewed with patient and updated if appropriate.  Patient Active Problem List   Diagnosis Date Noted  . 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 Perennial allergic rhinitis with seasonal variation 11/17/2011    Current Outpatient Prescriptions on File Prior to Visit  Medication Sig Dispense Refill  . cetirizine (ZYRTEC) 10 MG tablet Take 10 mg by mouth daily as needed.    . Triamcinolone Acetonide (NASACORT ALLERGY 24HR) 55 MCG/ACT AERO Place into the nose daily.     No current facility-administered medications on file prior to visit.     Past Medical History:  Diagnosis Date  . ALLERGIC RHINITIS   . History of chicken pox   . Perennial allergic rhinitis with seasonal variation 11/17/2011   Allergy skin testing positive 11/13/11   . Spina bifida occulta   . Urticaria     Past Surgical History:  Procedure Laterality Date  . SPINAL FIXATION SURGERY  2002   [facility] for spina bifida repair    Social History   Social History  . Marital status: Married    Spouse name: N/A  . Number of children: 0  . Years of education: N/A   Occupational History  . NP [employer]    Social History Main Topics  . Smoking status: Never Smoker  . Smokeless tobacco: Never Used  . Alcohol use 0.0 oz/week     Comment: occasional   . Drug use: No  . Sexual activity: Not on file   Other Topics Concern  . Not on file   Social History Narrative   Exercises regularly    Family History  Problem Relation Age of Onset  . Diabetes Mother   . Hypertension Mother   . Diabetes Father   . Asthma Mother        undx  . Diabetes Maternal Grandmother   . Depression Mother     Review of Systems  Constitutional: Negative for appetite change, chills, fatigue  and fever.  Eyes: Negative for visual disturbance.  Respiratory: Negative for cough, shortness of breath and wheezing.   Cardiovascular: Negative for chest pain, palpitations and leg swelling.  Gastrointestinal: Negative for abdominal pain, blood in stool, constipation, diarrhea and nausea.       No gerd  Genitourinary: Negative for dysuria and hematuria.  Musculoskeletal: Negative for arthralgias and back pain.  Skin: Negative for color change and rash.  Neurological: Negative for light-headedness and headaches.  Psychiatric/Behavioral: Negative for dysphoric mood. The patient is not nervous/anxious.        Objective:   Vitals:   02/24/17 1507  BP: 118/80  Pulse: 73  Resp: 16  Temp: 98.3 F (36.8 C)   Filed Weights   02/24/17 1507  Weight: 138 lb (62.6 kg)   Body mass index is 25.45 kg/m.  Wt Readings from Last 3 Encounters:  02/24/17 138 lb (62.6 kg)  12/18/15 140 lb (63.5 kg)  03/09/13 137 lb (62.1 kg)     Physical Exam Constitutional: She appears well-developed and well-nourished. No distress.  HENT:  Head: Normocephalic and atraumatic.  Right Ear: External ear normal. Normal ear canal and TM Left Ear: External ear normal.  Normal ear canal and TM Mouth/Throat: Oropharynx is clear  and moist.  Eyes: Conjunctivae and EOM are normal.  Neck: Neck supple. No tracheal deviation present. No thyromegaly present.  No carotid bruit  Cardiovascular: Normal rate, regular rhythm and normal heart sounds.   No murmur heard.  No edema. Pulmonary/Chest: Effort normal and breath sounds normal. No respiratory distress. She has no wheezes. She has no rales.  Breast: deferred to Gyn Abdominal: Soft. She exhibits no distension. There is no tenderness.  Lymphadenopathy: She has no cervical adenopathy.  Skin: Skin is warm and dry. She is not diaphoretic.  Psychiatric: She has a normal mood and affect. Her behavior is normal.         Assessment & Plan:   Physical  exam: Screening blood work  ordered Immunizations   Up to date  Mammogram - had her first mammogram Gyn  Up to date  Eye exams  Up to date  Exercise  Running regularly Weight  BMI is good Skin  Substance abuse  See Problem List for Assessment and Plan of chronic medical problems.

## 2017-02-24 ENCOUNTER — Ambulatory Visit (INDEPENDENT_AMBULATORY_CARE_PROVIDER_SITE_OTHER): Payer: 59 | Admitting: Internal Medicine

## 2017-02-24 ENCOUNTER — Encounter: Payer: Self-pay | Admitting: Internal Medicine

## 2017-02-24 VITALS — BP 118/80 | HR 73 | Temp 98.3°F | Resp 16 | Wt 138.0 lb

## 2017-02-24 DIAGNOSIS — Z0000 Encounter for general adult medical examination without abnormal findings: Secondary | ICD-10-CM | POA: Diagnosis not present

## 2017-02-24 DIAGNOSIS — Z833 Family history of diabetes mellitus: Secondary | ICD-10-CM

## 2017-02-24 NOTE — Assessment & Plan Note (Signed)
a1c

## 2017-04-02 ENCOUNTER — Encounter: Payer: Self-pay | Admitting: Internal Medicine

## 2017-04-02 ENCOUNTER — Other Ambulatory Visit (INDEPENDENT_AMBULATORY_CARE_PROVIDER_SITE_OTHER): Payer: 59

## 2017-04-02 DIAGNOSIS — Z0000 Encounter for general adult medical examination without abnormal findings: Secondary | ICD-10-CM

## 2017-04-02 DIAGNOSIS — Z833 Family history of diabetes mellitus: Secondary | ICD-10-CM | POA: Diagnosis not present

## 2017-04-02 LAB — COMPREHENSIVE METABOLIC PANEL
ALBUMIN: 3.9 g/dL (ref 3.5–5.2)
ALT: 10 U/L (ref 0–35)
AST: 11 U/L (ref 0–37)
Alkaline Phosphatase: 55 U/L (ref 39–117)
BUN: 15 mg/dL (ref 6–23)
CHLORIDE: 105 meq/L (ref 96–112)
CO2: 27 meq/L (ref 19–32)
CREATININE: 0.73 mg/dL (ref 0.40–1.20)
Calcium: 9 mg/dL (ref 8.4–10.5)
GFR: 94.62 mL/min (ref >60.00)
Glucose, Bld: 97 mg/dL (ref 70–99)
Potassium: 4.2 mEq/L (ref 3.5–5.1)
SODIUM: 139 meq/L (ref 135–145)
Total Bilirubin: 0.5 mg/dL (ref 0.2–1.2)
Total Protein: 6.8 g/dL (ref 6.0–8.3)

## 2017-04-02 LAB — CBC WITH DIFFERENTIAL/PLATELET
BASOS PCT: 0.4 % (ref 0.0–3.0)
Basophils Absolute: 0 10*3/uL (ref 0.0–0.1)
EOS ABS: 0.1 10*3/uL (ref 0.0–0.7)
Eosinophils Relative: 2.1 % (ref 0.0–5.0)
HCT: 40.9 % (ref 36.0–46.0)
HEMOGLOBIN: 13.6 g/dL (ref 12.0–15.0)
Lymphocytes Relative: 30 % (ref 12.0–46.0)
Lymphs Abs: 2.2 10*3/uL (ref 0.7–4.0)
MCHC: 33.4 g/dL (ref 30.0–36.0)
MCV: 89.3 fl (ref 78.0–100.0)
MONO ABS: 0.5 10*3/uL (ref 0.1–1.0)
Monocytes Relative: 7.6 % (ref 3.0–12.0)
Neutro Abs: 4.3 10*3/uL (ref 1.4–7.7)
Neutrophils Relative %: 59.9 % (ref 43.0–77.0)
PLATELETS: 266 10*3/uL (ref 150.0–400.0)
RBC: 4.58 Mil/uL (ref 3.87–5.11)
RDW: 12.3 % (ref 11.5–15.5)
WBC: 7.2 10*3/uL (ref 4.0–10.5)

## 2017-04-02 LAB — LIPID PANEL
CHOL/HDL RATIO: 3
Cholesterol: 175 mg/dL (ref 0–200)
HDL: 64.4 mg/dL (ref >39.00)
LDL CALC: 98 mg/dL (ref 0–99)
NONHDL: 110.76
Triglycerides: 62 mg/dL (ref 0.0–149.0)
VLDL: 12.4 mg/dL (ref 0.0–40.0)

## 2017-04-02 LAB — TSH: TSH: 2.45 u[IU]/mL (ref 0.35–4.50)

## 2017-04-02 LAB — HEMOGLOBIN A1C: Hgb A1c MFr Bld: 5.6 % (ref 4.6–6.5)

## 2017-06-03 DIAGNOSIS — N946 Dysmenorrhea, unspecified: Secondary | ICD-10-CM | POA: Diagnosis not present

## 2017-06-03 MED FILL — NAPROXEN 500 MG TABLET: 500 | 15 days supply | Qty: 30 | Fill #0

## 2017-06-12 DIAGNOSIS — N979 Female infertility, unspecified: Secondary | ICD-10-CM | POA: Diagnosis not present

## 2017-06-20 ENCOUNTER — Other Ambulatory Visit (HOSPITAL_COMMUNITY): Payer: Self-pay | Admitting: Obstetrics and Gynecology

## 2017-06-20 DIAGNOSIS — Z3141 Encounter for fertility testing: Secondary | ICD-10-CM

## 2017-06-26 ENCOUNTER — Ambulatory Visit (HOSPITAL_COMMUNITY)
Admission: RE | Admit: 2017-06-26 | Discharge: 2017-06-26 | Disposition: A | Discharge status: Home / Self Care | Payer: 59 | Source: Ambulatory Visit | Attending: Obstetrics and Gynecology | Admitting: Obstetrics and Gynecology

## 2017-06-26 DIAGNOSIS — N979 Female infertility, unspecified: Secondary | ICD-10-CM | POA: Diagnosis not present

## 2017-06-26 DIAGNOSIS — Z3141 Encounter for fertility testing: Secondary | ICD-10-CM | POA: Insufficient documentation

## 2017-06-26 MED ORDER — IOPAMIDOL (ISOVUE-300) INJECTION 61%
30.0000 mL | Freq: Once | INTRAVENOUS | Status: AC | PRN
  Administered 2017-06-26: 7 mL

## 2017-11-27 ENCOUNTER — Other Ambulatory Visit: Payer: Self-pay

## 2017-11-27 ENCOUNTER — Encounter (HOSPITAL_COMMUNITY): Payer: Self-pay

## 2017-11-27 ENCOUNTER — Emergency Department (HOSPITAL_COMMUNITY)
Admission: EM | Admit: 2017-11-27 | Discharge: 2017-11-27 | Disposition: A | Discharge status: Home / Self Care | Payer: 59 | Attending: Emergency Medicine | Admitting: Emergency Medicine

## 2017-11-27 DIAGNOSIS — M7918 Myalgia, other site: Secondary | ICD-10-CM

## 2017-11-27 DIAGNOSIS — M25512 Pain in left shoulder: Secondary | ICD-10-CM | POA: Diagnosis not present

## 2017-11-27 DIAGNOSIS — Z79899 Other long term (current) drug therapy: Secondary | ICD-10-CM | POA: Insufficient documentation

## 2017-11-27 DIAGNOSIS — S20312A Abrasion of left front wall of thorax, initial encounter: Secondary | ICD-10-CM | POA: Diagnosis not present

## 2017-11-27 MED ORDER — METHOCARBAMOL 500 MG PO TABS: 500.0000 mg | ORAL_TABLET | Freq: Two times a day (BID) | ORAL | 0 refills | Status: DC

## 2017-11-27 NOTE — ED Notes (Signed)
ED Provider at bedside. 

## 2017-11-27 NOTE — ED Notes (Signed)
Patient reports feeling sore around neck and left shoulder

## 2017-11-27 NOTE — ED Notes (Signed)
E-signature not working. 
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
## 2017-11-27 NOTE — Discharge Instructions (Signed)
Please read attached information. If you experience any new or worsening signs or symptoms please return to the emergency room for evaluation. Please follow-up with your primary care provider or specialist as discussed. Please use medication prescribed only as directed and discontinue taking if you have any concerning signs or symptoms.   °

## 2017-11-27 NOTE — ED Notes (Signed)
D/c reviewed with patient, no further questions at this time

## 2018-03-02 NOTE — Progress Notes (Signed)
Subjective:    Patient ID: Breanna Gonzalez, female    DOB: October 03, 1980, 37 y.o.   MRN: [id number]  HPI She is here for a physical exam.   She denies any changes in her health.  She has no concerns.   Medications and allergies reviewed with patient and updated if appropriate.  Patient Active Problem List   Diagnosis Date Noted  . Family history of diabetes mellitus (DM) 02/24/2017  . 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 Perennial allergic rhinitis with seasonal variation 11/17/2011    Current Outpatient Medications on File Prior to Visit  Medication Sig Dispense Refill  . cetirizine (ZYRTEC) 10 MG tablet Take 10 mg by mouth daily as needed.    . Triamcinolone Acetonide (NASACORT ALLERGY 24HR) 55 MCG/ACT AERO Place into the nose daily.     No current facility-administered medications on file prior to visit.     Past Medical History:  Diagnosis Date  . ALLERGIC RHINITIS   . History of chicken pox   . Perennial allergic rhinitis with seasonal variation 11/17/2011   Allergy skin testing positive 11/13/11   . Spina bifida occulta   . Urticaria     Past Surgical History:  Procedure Laterality Date  . SPINAL FIXATION SURGERY  2002   [facility] for spina bifida repair    Social History   Socioeconomic History  . Marital status: Married    Spouse name: Not on file  . Number of children: 0  . Years of education: Not on file  . Highest education level: Not on file  Occupational History  . Occupation: NP [employer]  Social Needs  . Financial resource strain: Not on file  . Food insecurity:    Worry: Not on file    Inability: Not on file  . Transportation needs:    Medical: Not on file    Non-medical: Not on file  Tobacco Use  . Smoking status: Never Smoker  . Smokeless tobacco: Never Used  Substance and Sexual Activity  . Alcohol use: Yes    Alcohol/week: 0.0 oz    Comment: occasional   . Drug use: No  . Sexual activity: Not on file  Lifestyle  .  Physical activity:    Days per week: Not on file    Minutes per session: Not on file  . Stress: Not on file  Relationships  . Social connections:    Talks on phone: Not on file    Gets together: Not on file    Attends religious service: Not on file    Active member of club or organization: Not on file    Attends meetings of clubs or organizations: Not on file    Relationship status: Not on file  Other Topics Concern  . Not on file  Social History Narrative   Exercises regularly - running 3-4 days a week 4-6 miles each time    Family History  Problem Relation Age of Onset  . Diabetes Mother   . Hypertension Mother   . Asthma Mother        undx  . Depression Mother   . Diabetes Maternal Grandmother     Review of Systems  Constitutional: Negative for chills and fever.  Eyes: Negative for visual disturbance.  Respiratory: Negative for cough, shortness of breath and wheezing.   Cardiovascular: Negative for chest pain, palpitations and leg swelling.  Gastrointestinal: Negative for abdominal pain, blood in stool, constipation, diarrhea and nausea.  No gerd  Genitourinary: Negative for dysuria and hematuria.  Musculoskeletal: Negative for arthralgias, back pain and myalgias.  Skin: Negative for color change and rash.  Neurological: Negative for light-headedness and headaches.  Psychiatric/Behavioral: Negative for dysphoric mood. The patient is not nervous/anxious.        Objective:   Vitals:   03/03/18 0754  BP: 110/64  Pulse: 68  Resp: 16  Temp: 98.1 F (36.7 C)  SpO2: 99%   Filed Weights   03/03/18 0754  Weight: 143 lb (64.9 kg)   Body mass index is 26.37 kg/m.  Wt Readings from Last 3 Encounters:  03/03/18 143 lb (64.9 kg)  02/24/17 138 lb (62.6 kg)  12/18/15 140 lb (63.5 kg)     Physical Exam Constitutional: She appears well-developed and well-nourished. No distress.  HENT:  Head: Normocephalic and atraumatic.  Right Ear: External ear normal.  Normal ear canal and TM Left Ear: External ear normal.  Normal ear canal and TM Mouth/Throat: Oropharynx is clear and moist.  Eyes: Conjunctivae and EOM are normal.  Neck: Neck supple. No tracheal deviation present. No thyromegaly present.  No carotid bruit  Cardiovascular: Normal rate, regular rhythm and normal heart sounds.   No murmur heard.  No edema. Pulmonary/Chest: Effort normal and breath sounds normal. No respiratory distress. She has no wheezes. She has no rales.  Breast: deferred to Gyn Abdominal: Soft. She exhibits no distension. There is no tenderness.  Lymphadenopathy: She has no cervical adenopathy.  Skin: Skin is warm and dry. She is not diaphoretic.  Psychiatric: She has a normal mood and affect. Her behavior is normal.        Assessment & Plan:   Physical exam: Screening blood work  ordered Immunizations   Up to date  Gyn  Up to date - has appt this week Exercise  Running a few times a week Weight  Mildly overweight - will work on weight loss Diet:  Could eat less sugars - discussed with her family history she should decrease her sugars intake Skin   No concerns Substance abuse  none  See Problem List for Assessment and Plan of chronic medical problems.    FU in one year

## 2018-03-02 NOTE — Patient Instructions (Addendum)
Test(s) ordered today. Your results will be released to [facility] (or called to you) after review, usually within 72hours after test completion. If any changes need to be made, you will be notified at that same time.  All other Health Maintenance issues reviewed.   All recommended immunizations and age-appropriate screenings are up-to-date or discussed.  No immunizations administered today.   Medications reviewed and updated.  No changes recommended at this time.   Please followup in one year   Health Maintenance, Female Adopting a healthy lifestyle and getting preventive care can go a long way to promote health and wellness. Talk with your health care provider about what schedule of regular examinations is right for you. This is a good chance for you to check in with your provider about disease prevention and staying healthy. In between checkups, there are plenty of things you can do on your own. Experts have done a lot of research about which lifestyle changes and preventive measures are most likely to keep you healthy. Ask your health care provider for more information. Weight and diet Eat a healthy diet  Be sure to include plenty of vegetables, fruits, low-fat dairy products, and lean protein.  Do not eat a lot of foods high in solid fats, added sugars, or salt.  Get regular exercise. This is one of the most important things you can do for your health. ? Most adults should exercise for at least 150 minutes each week. The exercise should increase your heart rate and make you sweat (moderate-intensity exercise). ? Most adults should also do strengthening exercises at least twice a week. This is in addition to the moderate-intensity exercise.  Maintain a healthy weight  Body mass index (BMI) is a measurement that can be used to identify possible weight problems. It estimates body fat based on height and weight. Your health care provider can help determine your BMI and help you achieve or  maintain a healthy weight.  For females 64 years of age and older: ? A BMI below 18.5 is considered underweight. ? A BMI of 18.5 to 24.9 is normal. ? A BMI of 25 to 29.9 is considered overweight. ? A BMI of 30 and above is considered obese.  Watch levels of cholesterol and blood lipids  You should start having your blood tested for lipids and cholesterol at 39 years of age, then have this test every 5 years.  You may need to have your cholesterol levels checked more often if: ? Your lipid or cholesterol levels are high. ? You are older than 39 years of age. ? You are at high risk for heart disease.  Cancer screening Lung Cancer  Lung cancer screening is recommended for adults 72-7 years old who are at high risk for lung cancer because of a history of smoking.  A yearly low-dose CT scan of the lungs is recommended for people who: ? Currently smoke. ? Have quit within the past 15 years. ? Have at least a 30-pack-year history of smoking. A pack year is smoking an average of one pack of cigarettes a day for 1 year.  Yearly screening should continue until it has been 15 years since you quit.  Yearly screening should stop if you develop a health problem that would prevent you from having lung cancer treatment.  Breast Cancer  Practice breast self-awareness. This means understanding how your breasts normally appear and feel.  It also means doing regular breast self-exams. Let your health care provider know about any changes,  no matter how small.  If you are in your 20s or 30s, you should have a clinical breast exam (CBE) by a health care provider every 1-3 years as part of a regular health exam.  If you are 40 or older, have a CBE every year. Also consider having a breast X-ray (mammogram) every year.  If you have a family history of breast cancer, talk to your health care provider about genetic screening.  If you are at high risk for breast cancer, talk to your health care  provider about having an MRI and a mammogram every year.  Breast cancer gene (BRCA) assessment is recommended for women who have family members with BRCA-related cancers. BRCA-related cancers include: ? Breast. ? Ovarian. ? Tubal. ? Peritoneal cancers.  Results of the assessment will determine the need for genetic counseling and BRCA1 and BRCA2 testing.  Cervical Cancer Your health care provider may recommend that you be screened regularly for cancer of the pelvic organs (ovaries, uterus, and vagina). This screening involves a pelvic examination, including checking for microscopic changes to the surface of your cervix (Pap test). You may be encouraged to have this screening done every 3 years, beginning at age 21.  For women ages 30-65, health care providers may recommend pelvic exams and Pap testing every 3 years, or they may recommend the Pap and pelvic exam, combined with testing for human papilloma virus (HPV), every 5 years. Some types of HPV increase your risk of cervical cancer. Testing for HPV may also be done on women of any age with unclear Pap test results.  Other health care providers may not recommend any screening for nonpregnant women who are considered low risk for pelvic cancer and who do not have symptoms. Ask your health care provider if a screening pelvic exam is right for you.  If you have had past treatment for cervical cancer or a condition that could lead to cancer, you need Pap tests and screening for cancer for at least 20 years after your treatment. If Pap tests have been discontinued, your risk factors (such as having a new sexual partner) need to be reassessed to determine if screening should resume. Some women have medical problems that increase the chance of getting cervical cancer. In these cases, your health care provider may recommend more frequent screening and Pap tests.  Colorectal Cancer  This type of cancer can be detected and often prevented.  Routine  colorectal cancer screening usually begins at 39 years of age and continues through 39 years of age.  Your health care provider may recommend screening at an earlier age if you have risk factors for colon cancer.  Your health care provider may also recommend using home test kits to check for hidden blood in the stool.  A small camera at the end of a tube can be used to examine your colon directly (sigmoidoscopy or colonoscopy). This is done to check for the earliest forms of colorectal cancer.  Routine screening usually begins at age 50.  Direct examination of the colon should be repeated every 5-10 years through 39 years of age. However, you may need to be screened more often if early forms of precancerous polyps or small growths are found.  Skin Cancer  Check your skin from head to toe regularly.  Tell your health care provider about any new moles or changes in moles, especially if there is a change in a mole's shape or color.  Also tell your health care provider if you   have a mole that is larger than the size of a pencil eraser.  Always use sunscreen. Apply sunscreen liberally and repeatedly throughout the day.  Protect yourself by wearing long sleeves, pants, a wide-brimmed hat, and sunglasses whenever you are outside.  Heart disease, diabetes, and high blood pressure  High blood pressure causes heart disease and increases the risk of stroke. High blood pressure is more likely to develop in: ? People who have blood pressure in the high end of the normal range (130-139/85-89 mm Hg). ? People who are overweight or obese. ? People who are African American.  If you are 24-25 years of age, have your blood pressure checked every 3-5 years. If you are 2 years of age or older, have your blood pressure checked every year. You should have your blood pressure measured twice-once when you are at a hospital or clinic, and once when you are not at a hospital or clinic. Record the average of the  two measurements. To check your blood pressure when you are not at a hospital or clinic, you can use: ? An automated blood pressure machine at a pharmacy. ? A home blood pressure monitor.  If you are between 42 years and 59 years old, ask your health care provider if you should take aspirin to prevent strokes.  Have regular diabetes screenings. This involves taking a blood sample to check your fasting blood sugar level. ? If you are at a normal weight and have a low risk for diabetes, have this test once every three years after 39 years of age. ? If you are overweight and have a high risk for diabetes, consider being tested at a younger age or more often. Preventing infection Hepatitis B  If you have a higher risk for hepatitis B, you should be screened for this virus. You are considered at high risk for hepatitis B if: ? You were born in a country where hepatitis B is common. Ask your health care provider which countries are considered high risk. ? Your parents were born in a high-risk country, and you have not been immunized against hepatitis B (hepatitis B vaccine). ? You have HIV or AIDS. ? You use needles to inject street drugs. ? You live with someone who has hepatitis B. ? You have had sex with someone who has hepatitis B. ? You get hemodialysis treatment. ? You take certain medicines for conditions, including cancer, organ transplantation, and autoimmune conditions.  Hepatitis C  Blood testing is recommended for: ? Everyone born from [birth year] through 1967. ? Anyone with known risk factors for hepatitis C.  Sexually transmitted infections (STIs)  You should be screened for sexually transmitted infections (STIs) including gonorrhea and chlamydia if: ? You are sexually active and are younger than 39 years of age. ? You are older than 39 years of age and your health care provider tells you that you are at risk for this type of infection. ? Your sexual activity has changed since you  were last screened and you are at an increased risk for chlamydia or gonorrhea. Ask your health care provider if you are at risk.  If you do not have HIV, but are at risk, it may be recommended that you take a prescription medicine daily to prevent HIV infection. This is called pre-exposure prophylaxis (PrEP). You are considered at risk if: ? You are sexually active and do not regularly use condoms or know the HIV status of your partner(s). ? You take drugs by injection. ?  You are sexually active with a partner who has HIV.  Talk with your health care provider about whether you are at high risk of being infected with HIV. If you choose to begin PrEP, you should first be tested for HIV. You should then be tested every 3 months for as long as you are taking PrEP. Pregnancy  If you are premenopausal and you may become pregnant, ask your health care provider about preconception counseling.  If you may become pregnant, take 400 to 800 micrograms (mcg) of folic acid every day.  If you want to prevent pregnancy, talk to your health care provider about birth control (contraception). Osteoporosis and menopause  Osteoporosis is a disease in which the bones lose minerals and strength with aging. This can result in serious bone fractures. Your risk for osteoporosis can be identified using a bone density scan.  If you are 65 years of age or older, or if you are at risk for osteoporosis and fractures, ask your health care provider if you should be screened.  Ask your health care provider whether you should take a calcium or vitamin D supplement to lower your risk for osteoporosis.  Menopause may have certain physical symptoms and risks.  Hormone replacement therapy may reduce some of these symptoms and risks. Talk to your health care provider about whether hormone replacement therapy is right for you. Follow these instructions at home:  Schedule regular health, dental, and eye exams.  Stay current  with your immunizations.  Do not use any tobacco products including cigarettes, chewing tobacco, or electronic cigarettes.  If you are pregnant, do not drink alcohol.  If you are breastfeeding, limit how much and how often you drink alcohol.  Limit alcohol intake to no more than 1 drink per day for nonpregnant women. One drink equals 12 ounces of beer, 5 ounces of wine, or 1 ounces of hard liquor.  Do not use street drugs.  Do not share needles.  Ask your health care provider for help if you need support or information about quitting drugs.  Tell your health care provider if you often feel depressed.  Tell your health care provider if you have ever been abused or do not feel safe at home. This information is not intended to replace advice given to you by your health care provider. Make sure you discuss any questions you have with your health care provider. Document Released: 03/18/2011 Document Revised: 02/08/2016 Document Reviewed: 06/06/2015 Elsevier Interactive Patient Education  2018 [employer]  

## 2018-03-03 ENCOUNTER — Ambulatory Visit (INDEPENDENT_AMBULATORY_CARE_PROVIDER_SITE_OTHER): Payer: 59 | Admitting: Internal Medicine

## 2018-03-03 ENCOUNTER — Encounter: Payer: Self-pay | Admitting: Internal Medicine

## 2018-03-03 ENCOUNTER — Other Ambulatory Visit (INDEPENDENT_AMBULATORY_CARE_PROVIDER_SITE_OTHER): Payer: 59

## 2018-03-03 VITALS — BP 110/64 | HR 68 | Temp 98.1°F | Resp 16 | Ht 61.75 in | Wt 143.0 lb

## 2018-03-03 DIAGNOSIS — Z833 Family history of diabetes mellitus: Secondary | ICD-10-CM

## 2018-03-03 DIAGNOSIS — Z0000 Encounter for general adult medical examination without abnormal findings: Secondary | ICD-10-CM

## 2018-03-03 LAB — COMPREHENSIVE METABOLIC PANEL
ALT: 14 U/L (ref 0–35)
AST: 13 U/L (ref 0–37)
Albumin: 4.3 g/dL (ref 3.5–5.2)
Alkaline Phosphatase: 59 U/L (ref 39–117)
BUN: 14 mg/dL (ref 6–23)
CO2: 29 meq/L (ref 19–32)
Calcium: 9.3 mg/dL (ref 8.4–10.5)
Chloride: 104 mEq/L (ref 96–112)
Creatinine, Ser: 0.68 mg/dL (ref 0.40–1.20)
GFR: 102.2 mL/min (ref >60.00)
GLUCOSE: 97 mg/dL (ref 70–99)
POTASSIUM: 4.1 meq/L (ref 3.5–5.1)
Sodium: 140 mEq/L (ref 135–145)
Total Bilirubin: 0.4 mg/dL (ref 0.2–1.2)
Total Protein: 7.3 g/dL (ref 6.0–8.3)

## 2018-03-03 LAB — CBC WITH DIFFERENTIAL/PLATELET
BASOS ABS: 0 10*3/uL (ref 0.0–0.1)
BASOS PCT: 0.5 % (ref 0.0–3.0)
Eosinophils Absolute: 0.1 10*3/uL (ref 0.0–0.7)
Eosinophils Relative: 1.4 % (ref 0.0–5.0)
HEMATOCRIT: 42.1 % (ref 36.0–46.0)
HEMOGLOBIN: 14.3 g/dL (ref 12.0–15.0)
LYMPHS PCT: 29.2 % (ref 12.0–46.0)
Lymphs Abs: 2.2 10*3/uL (ref 0.7–4.0)
MCHC: 34 g/dL (ref 30.0–36.0)
MCV: 88 fl (ref 78.0–100.0)
MONOS PCT: 7.9 % (ref 3.0–12.0)
Monocytes Absolute: 0.6 10*3/uL (ref 0.1–1.0)
NEUTROS ABS: 4.7 10*3/uL (ref 1.4–7.7)
Neutrophils Relative %: 61 % (ref 43.0–77.0)
PLATELETS: 309 10*3/uL (ref 150.0–400.0)
RBC: 4.78 Mil/uL (ref 3.87–5.11)
RDW: 12.3 % (ref 11.5–15.5)
WBC: 7.6 10*3/uL (ref 4.0–10.5)

## 2018-03-03 LAB — HEMOGLOBIN A1C: Hgb A1c MFr Bld: 5.9 % (ref 4.6–6.5)

## 2018-03-03 LAB — LIPID PANEL
CHOL/HDL RATIO: 3
Cholesterol: 203 mg/dL — ABNORMAL HIGH (ref 0–200)
HDL: 65.5 mg/dL (ref >39.00)
LDL CALC: 120 mg/dL — AB (ref 0–99)
NONHDL: 137.33
Triglycerides: 88 mg/dL (ref 0.0–149.0)
VLDL: 17.6 mg/dL (ref 0.0–40.0)

## 2018-03-03 LAB — TSH: TSH: 2.43 u[IU]/mL (ref 0.35–4.50)

## 2018-03-03 NOTE — Assessment & Plan Note (Signed)
Check a1c Low sugar / carb diet Stressed regular exercise   

## 2018-03-05 ENCOUNTER — Encounter: Payer: Self-pay | Admitting: Internal Medicine

## 2018-03-05 DIAGNOSIS — Z6827 Body mass index (BMI) 27.0-27.9, adult: Secondary | ICD-10-CM | POA: Diagnosis not present

## 2018-03-05 DIAGNOSIS — R7303 Prediabetes: Secondary | ICD-10-CM | POA: Insufficient documentation

## 2018-03-05 DIAGNOSIS — Z01419 Encounter for gynecological examination (general) (routine) without abnormal findings: Secondary | ICD-10-CM | POA: Diagnosis not present

## 2018-05-27 ENCOUNTER — Encounter: Payer: Self-pay | Admitting: Internal Medicine

## 2018-06-21 ENCOUNTER — Encounter: Payer: Self-pay | Admitting: Internal Medicine

## 2018-06-21 DIAGNOSIS — Z299 Encounter for prophylactic measures, unspecified: Secondary | ICD-10-CM

## 2018-06-26 ENCOUNTER — Other Ambulatory Visit: Payer: 59

## 2018-06-26 DIAGNOSIS — Z299 Encounter for prophylactic measures, unspecified: Secondary | ICD-10-CM

## 2018-06-29 ENCOUNTER — Encounter: Payer: Self-pay | Admitting: Internal Medicine

## 2018-06-29 LAB — QUANTIFERON-TB GOLD PLUS
NIL: 0.01 IU/mL
QuantiFERON-TB Gold Plus: NEGATIVE
TB1-NIL: 0.01 IU/mL
TB2-NIL: 0.01 IU/mL

## 2018-06-29 LAB — MEASLES/MUMPS/RUBELLA IMMUNITY
Mumps IgG: <9 AU/mL — ABNORMAL LOW
Rubella: 3.73 index
Rubeola IgG: <25 AU/mL — ABNORMAL LOW

## 2018-07-03 ENCOUNTER — Ambulatory Visit (INDEPENDENT_AMBULATORY_CARE_PROVIDER_SITE_OTHER): Payer: 59

## 2018-07-03 DIAGNOSIS — Z299 Encounter for prophylactic measures, unspecified: Secondary | ICD-10-CM

## 2018-07-03 DIAGNOSIS — Z23 Encounter for immunization: Secondary | ICD-10-CM | POA: Diagnosis not present

## 2018-08-05 ENCOUNTER — Ambulatory Visit: Payer: 59

## 2018-08-07 ENCOUNTER — Ambulatory Visit (INDEPENDENT_AMBULATORY_CARE_PROVIDER_SITE_OTHER): Payer: 59

## 2018-08-07 DIAGNOSIS — Z23 Encounter for immunization: Secondary | ICD-10-CM | POA: Diagnosis not present

## 2018-08-07 DIAGNOSIS — Z299 Encounter for prophylactic measures, unspecified: Secondary | ICD-10-CM

## 2018-09-29 MED FILL — NAPROXEN 500 MG TABLET: 500 | 15 days supply | Qty: 30 | Fill #0

## 2019-07-20 IMAGING — RF DG HYSTEROGRAM
7 series · 7 of 7 positions shown · IV contrast (omnipaque)
Comparison: None.

CLINICAL DATA: Primary infertility.

EXAM:
HYSTEROSALPINGOGRAM
TECHNIQUE: Following cleansing of the cervix and vagina with Betadine solution,
a hysterosalpingogram was performed using a 5-French
hysterosalpingogram catheter and Omnipaque 300 contrast. The patient
tolerated the examination without difficulty.

[Series 1: run · 1 of 1 slices shown (1 of 7)]
[im 1/1]
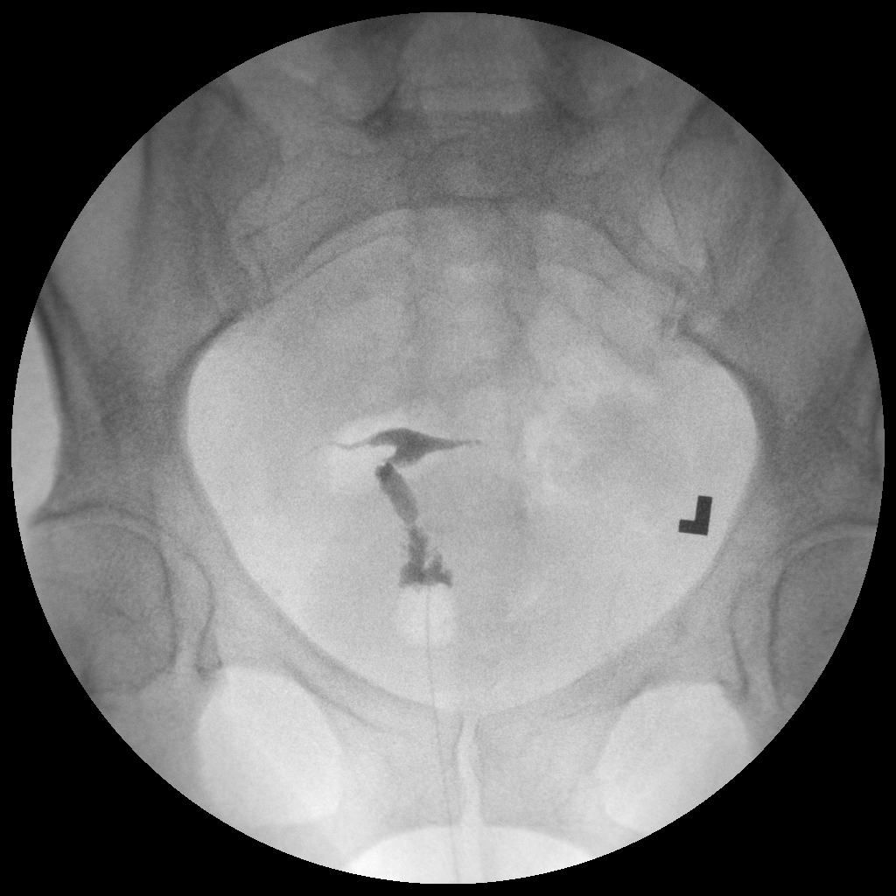

[Series 2: run · 1 of 1 slices shown (2 of 7)]
[im 1/1]
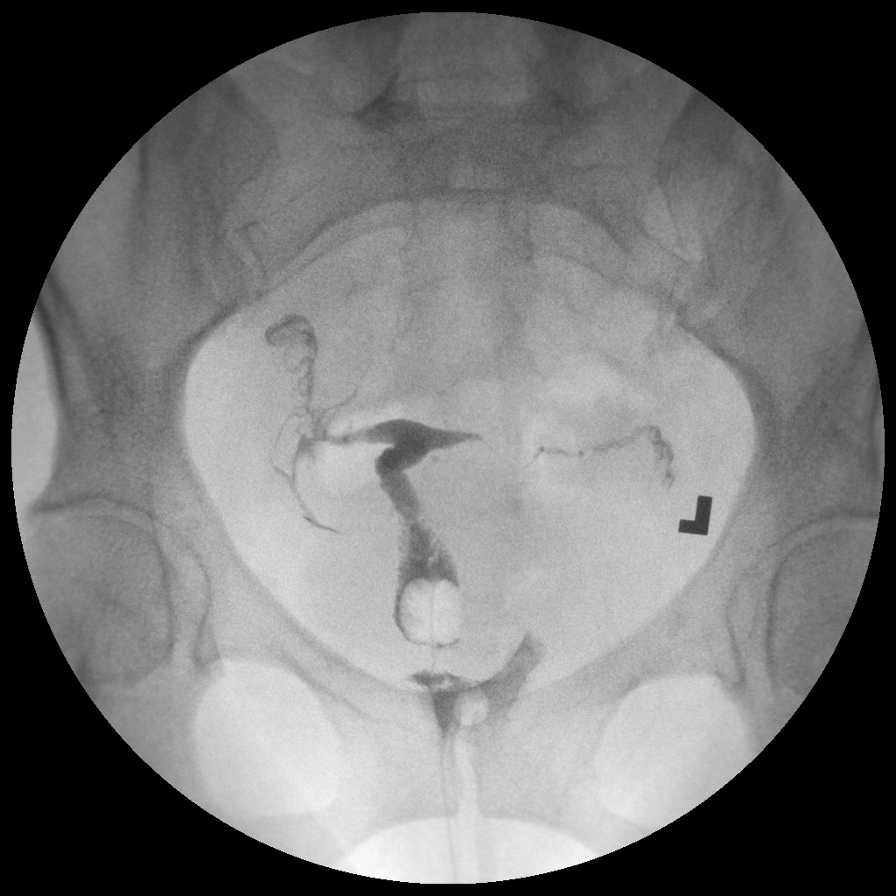

[Series 3: run · 1 of 1 slices shown (3 of 7)]
[im 1/1]
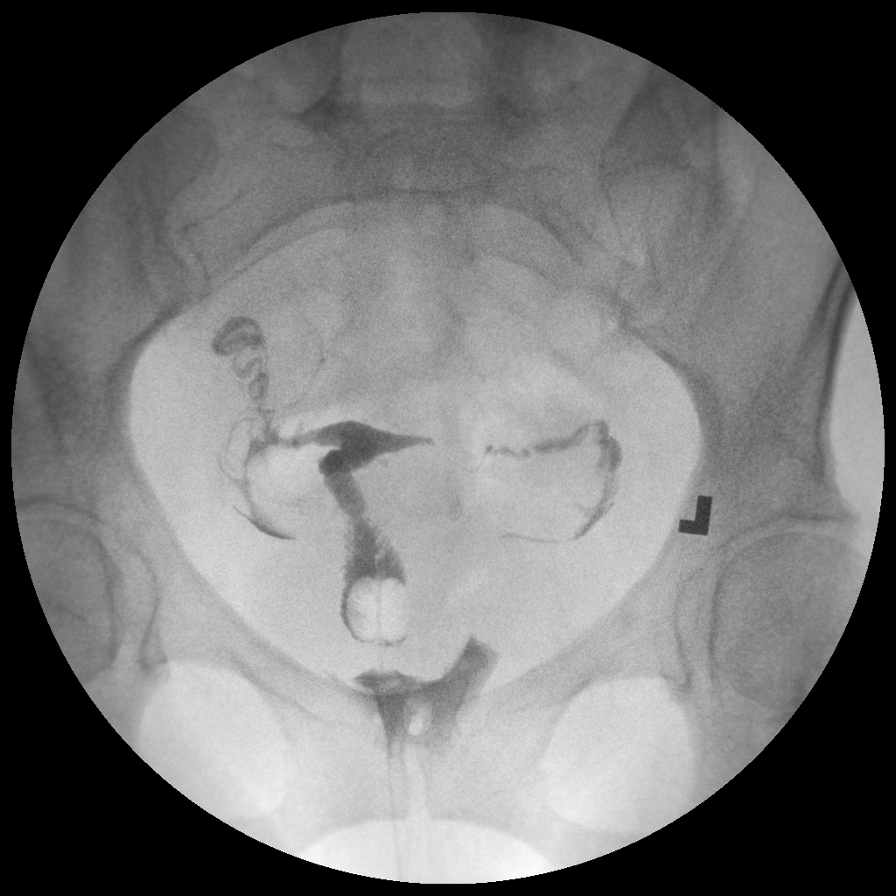

[Series 4: run · 1 of 1 slices shown (4 of 7)]
[im 1/1]
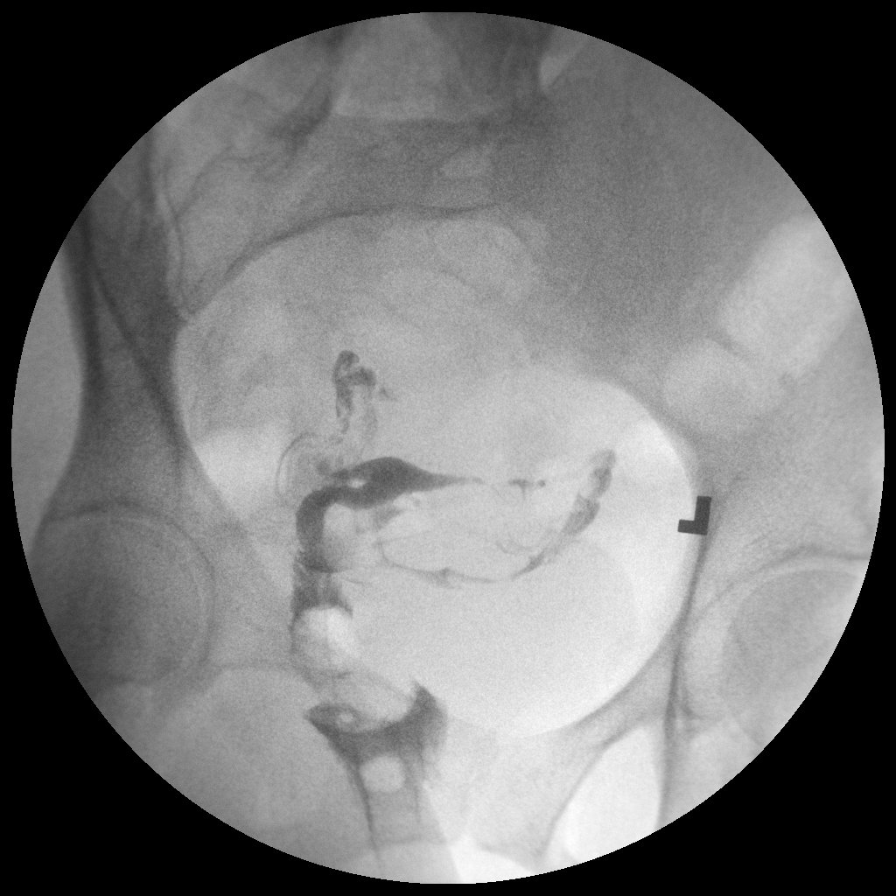

[Series 5: run · 1 of 1 slices shown (5 of 7)]
[im 1/1]
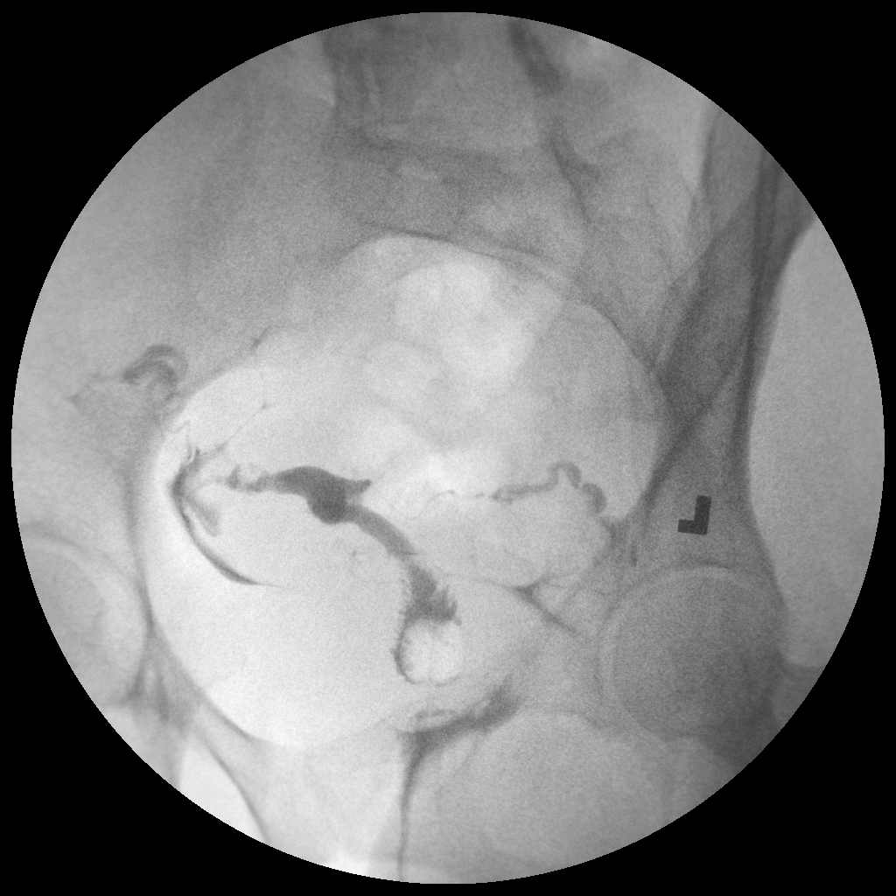

[Series 6: run · 1 of 1 slices shown (6 of 7)]
[im 1/1]
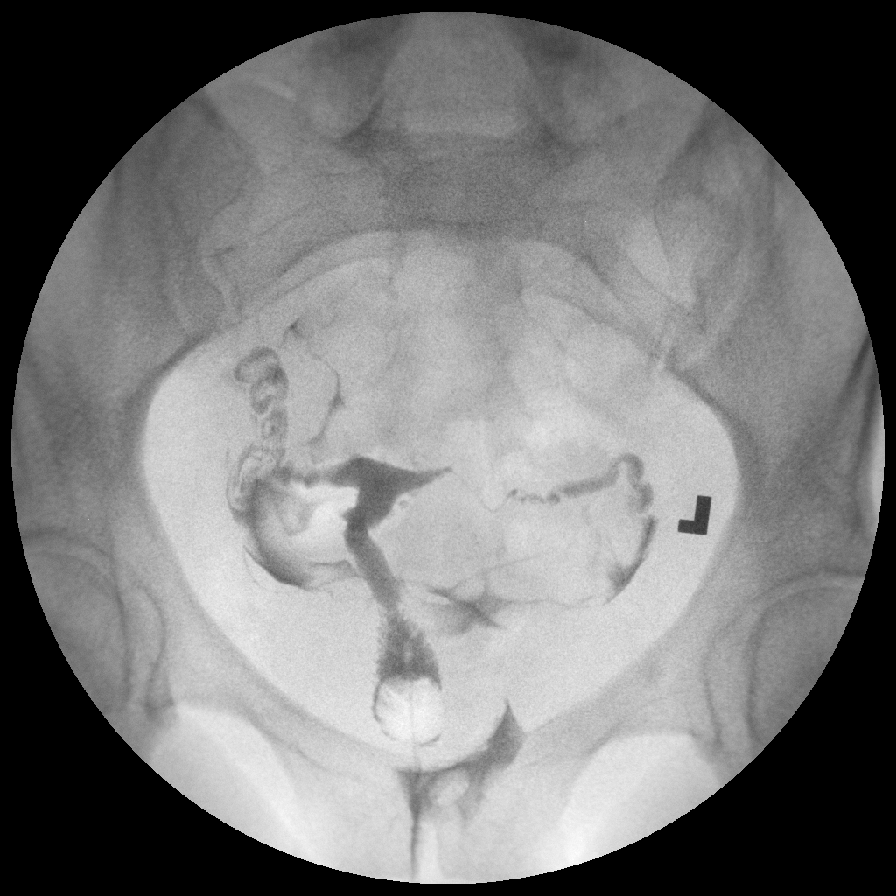

[Series 7: run · 1 of 1 slices shown (7 of 7)]
[im 1/1]
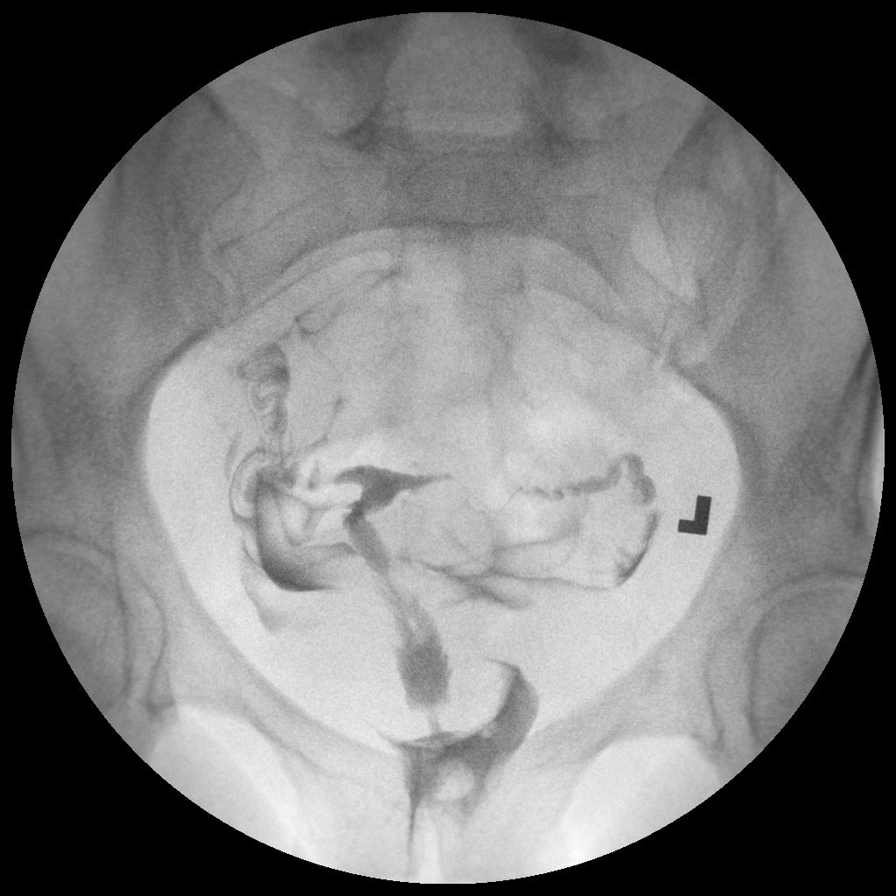

[7 of 7 positions shown; findings below may reference images not displayed]

FLUOROSCOPY TIME:  Fluoroscopy Time:  0 minutes 48 seconds

Number of Acquired Images:  0
FINDINGS: Endometrial Cavity: Normal appearance. No signs of Mullerian duct
anomaly or other significant abnormality.

Right Fallopian Tube: Normal appearance. Free intraperitoneal spill
of contrast is demonstrated.

Left Fallopian Tube: Normal appearance. Free intraperitoneal spill
of contrast is demonstrated.

Other:  None.
IMPRESSION: Unremarkable exam.  Bilateral tubal patency demonstrated.

## 2019-12-27 DIAGNOSIS — Z01419 Encounter for gynecological examination (general) (routine) without abnormal findings: Secondary | ICD-10-CM | POA: Diagnosis not present

## 2019-12-27 DIAGNOSIS — Z8049 Family history of malignant neoplasm of other genital organs: Secondary | ICD-10-CM | POA: Diagnosis not present

## 2019-12-27 DIAGNOSIS — Z6825 Body mass index (BMI) 25.0-25.9, adult: Secondary | ICD-10-CM | POA: Diagnosis not present

## 2019-12-27 DIAGNOSIS — Z800 Family history of malignant neoplasm of digestive organs: Secondary | ICD-10-CM | POA: Diagnosis not present

## 2019-12-27 DIAGNOSIS — Z8371 Family history of colonic polyps: Secondary | ICD-10-CM | POA: Diagnosis not present

## 2019-12-29 MED FILL — LO LOESTRIN FE 1-10 TABLET: 1 MG-10 MCG | 28 days supply | Qty: 28 | Fill #0

## 2020-02-02 MED FILL — LO LOESTRIN FE 1-10 TABLET: 1 MG-10 MCG | 28 days supply | Qty: 28 | Fill #1

## 2020-03-07 MED FILL — LO LOESTRIN FE 1-10 TABLET: 1 MG-10 MCG | 28 days supply | Qty: 28 | Fill #2

## 2020-03-12 NOTE — Patient Instructions (Addendum)
Blood work was ordered.    All other Health Maintenance issues reviewed.   All recommended immunizations and age-appropriate screenings are up-to-date or discussed.  No immunization administered today.   Medications reviewed and updated.  Changes include :   none     Please followup in 1 year    Health Maintenance, Female Adopting a healthy lifestyle and getting preventive care are important in promoting health and wellness. Ask your health care provider about:  The right schedule for you to have regular tests and exams.  Things you can do on your own to prevent diseases and keep yourself healthy. What should I know about diet, weight, and exercise? Eat a healthy diet   Eat a diet that includes plenty of vegetables, fruits, low-fat dairy products, and lean protein.  Do not eat a lot of foods that are high in solid fats, added sugars, or sodium. Maintain a healthy weight Body mass index (BMI) is used to identify weight problems. It estimates body fat based on height and weight. Your health care provider can help determine your BMI and help you achieve or maintain a healthy weight. Get regular exercise Get regular exercise. This is one of the most important things you can do for your health. Most adults should:  Exercise for at least 150 minutes each week. The exercise should increase your heart rate and make you sweat (moderate-intensity exercise).  Do strengthening exercises at least twice a week. This is in addition to the moderate-intensity exercise.  Spend less time sitting. Even light physical activity can be beneficial. Watch cholesterol and blood lipids Have your blood tested for lipids and cholesterol at 41 years of age, then have this test every 5 years. Have your cholesterol levels checked more often if:  Your lipid or cholesterol levels are high.  You are older than 40 years of age.  You are at high risk for heart disease. What should I know about cancer  screening? Depending on your health history and family history, you may need to have cancer screening at various ages. This may include screening for:  Breast cancer.  Cervical cancer.  Colorectal cancer.  Skin cancer.  Lung cancer. What should I know about heart disease, diabetes, and high blood pressure? Blood pressure and heart disease  High blood pressure causes heart disease and increases the risk of stroke. This is more likely to develop in people who have high blood pressure readings, are of African descent, or are overweight.  Have your blood pressure checked: ? Every 3-5 years if you are 18-39 years of age. ? Every year if you are 40 years old or older. Diabetes Have regular diabetes screenings. This checks your fasting blood sugar level. Have the screening done:  Once every three years after age 40 if you are at a normal weight and have a low risk for diabetes.  More often and at a younger age if you are overweight or have a high risk for diabetes. What should I know about preventing infection? Hepatitis B If you have a higher risk for hepatitis B, you should be screened for this virus. Talk with your health care provider to find out if you are at risk for hepatitis B infection. Hepatitis C Testing is recommended for:  Everyone born from 1945 through 1965.  Anyone with known risk factors for hepatitis C. Sexually transmitted infections (STIs)  Get screened for STIs, including gonorrhea and chlamydia, if: ? You are sexually active and are younger than 41 years   of age. ? You are older than 41 years of age and your health care provider tells you that you are at risk for this type of infection. ? Your sexual activity has changed since you were last screened, and you are at increased risk for chlamydia or gonorrhea. Ask your health care provider if you are at risk.  Ask your health care provider about whether you are at high risk for HIV. Your health care provider may  recommend a prescription medicine to help prevent HIV infection. If you choose to take medicine to prevent HIV, you should first get tested for HIV. You should then be tested every 3 months for as long as you are taking the medicine. Pregnancy  If you are about to stop having your period (premenopausal) and you may become pregnant, seek counseling before you get pregnant.  Take 400 to 800 micrograms (mcg) of folic acid every day if you become pregnant.  Ask for birth control (contraception) if you want to prevent pregnancy. Osteoporosis and menopause Osteoporosis is a disease in which the bones lose minerals and strength with aging. This can result in bone fractures. If you are 65 years old or older, or if you are at risk for osteoporosis and fractures, ask your health care provider if you should:  Be screened for bone loss.  Take a calcium or vitamin D supplement to lower your risk of fractures.  Be given hormone replacement therapy (HRT) to treat symptoms of menopause. Follow these instructions at home: Lifestyle  Do not use any products that contain nicotine or tobacco, such as cigarettes, e-cigarettes, and chewing tobacco. If you need help quitting, ask your health care provider.  Do not use street drugs.  Do not share needles.  Ask your health care provider for help if you need support or information about quitting drugs. Alcohol use  Do not drink alcohol if: ? Your health care provider tells you not to drink. ? You are pregnant, may be pregnant, or are planning to become pregnant.  If you drink alcohol: ? Limit how much you use to 0-1 drink a day. ? Limit intake if you are breastfeeding.  Be aware of how much alcohol is in your drink. In the [place] one drink equals one 12 oz bottle of beer (355 mL), one 5 oz glass of wine (148 mL), or one 1 oz glass of hard liquor (44 mL). General instructions  Schedule regular health, dental, and eye exams.  Stay current with your  vaccines.  Tell your health care provider if: ? You often feel depressed. ? You have ever been abused or do not feel safe at home. Summary  Adopting a healthy lifestyle and getting preventive care are important in promoting health and wellness.  Follow your health care provider's instructions about healthy diet, exercising, and getting tested or screened for diseases.  Follow your health care provider's instructions on monitoring your cholesterol and blood pressure. This information is not intended to replace advice given to you by your health care provider. Make sure you discuss any questions you have with your health care provider. Document Revised: 08/26/2018 Document Reviewed: 08/26/2018 Elsevier Patient Education  2020 Elsevier Inc.  

## 2020-03-12 NOTE — Progress Notes (Signed)
Subjective:    Patient ID: Breanna Gonzalez, female    DOB: 06-Mar-1981, 39 y.o.   MRN: [id number]  HPI She is here for a physical exam.   She lost 15 lbs and then she was in her post masters program and with covid gained the weight back.    Medications and allergies reviewed with patient and updated if appropriate.  Patient Active Problem List   Diagnosis Date Noted  . Prediabetes 03/05/2018  . Family history of diabetes mellitus (DM) 02/24/2017  . 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 Perennial allergic rhinitis with seasonal variation 11/17/2011    Current Outpatient Medications on File Prior to Visit  Medication Sig Dispense Refill  . cetirizine (ZYRTEC) 10 MG tablet Take 10 mg by mouth daily as needed.    . LO LOESTRIN FE 1 MG-10 MCG / 10 MCG tablet     . Triamcinolone Acetonide (NASACORT ALLERGY 24HR) 55 MCG/ACT AERO Place into the nose daily.     No current facility-administered medications on file prior to visit.    Past Medical History:  Diagnosis Date  . ALLERGIC RHINITIS   . History of chicken pox   . Perennial allergic rhinitis with seasonal variation 11/17/2011   Allergy skin testing positive 11/13/11   . Spina bifida occulta   . Urticaria     Past Surgical History:  Procedure Laterality Date  . SPINAL FIXATION SURGERY  2002   [facility] for spina bifida repair    Social History   Socioeconomic History  . Marital status: Married    Spouse name: Not on file  . Number of children: 0  . Years of education: Not on file  . Highest education level: Not on file  Occupational History  . Occupation: NP [employer]  Tobacco Use  . Smoking status: Never Smoker  . Smokeless tobacco: Never Used  Substance and Sexual Activity  . Alcohol use: Yes    Alcohol/week: 0.0 standard drinks    Comment: occasional   . Drug use: No  . Sexual activity: Not on file  Other Topics Concern  . Not on file  Social History Narrative   Exercises regularly -  running 3-4 days a week 4-6 miles each time   Social Determinants of Health   Financial Resource Strain:   . Difficulty of Paying Living Expenses:   Food Insecurity:   . Worried About Programme researcher, broadcasting/film/video in the Last Year:   . Barista in the Last Year:   Transportation Needs:   . Freight forwarder (Medical):   Marland Kitchen Lack of Transportation (Non-Medical):   Physical Activity:   . Days of Exercise per Week:   . Minutes of Exercise per Session:   Stress:   . Feeling of Stress :   Social Connections:   . Frequency of Communication with Friends and Family:   . Frequency of Social Gatherings with Friends and Family:   . Attends Religious Services:   . Active Member of Clubs or Organizations:   . Attends Banker Meetings:   Marland Kitchen Marital Status:     Family History  Problem Relation Age of Onset  . Diabetes Mother   . Hypertension Mother   . Asthma Mother        undx  . Depression Mother   . Diabetes Maternal Grandmother     Review of Systems  Constitutional: Negative for chills and fever.  Eyes: Negative for visual disturbance.  Respiratory: Negative for  cough, shortness of breath and wheezing.   Cardiovascular: Negative for chest pain, palpitations and leg swelling.  Gastrointestinal: Negative for abdominal pain, blood in stool, constipation, diarrhea and nausea.       No gerd  Genitourinary: Negative for dysuria and hematuria.  Musculoskeletal: Negative for arthralgias and back pain.  Skin: Negative for color change and rash.  Neurological: Negative for dizziness, light-headedness and headaches.  Psychiatric/Behavioral: Negative for dysphoric mood. The patient is not nervous/anxious.        Objective:   Vitals:   03/13/20 1502  BP: 102/70  Pulse: 67  Temp: 98.1 F (36.7 C)  SpO2: 99%   Filed Weights   03/13/20 1502  Weight: 142 lb 9.6 oz (64.7 kg)   Body mass index is 26.29 kg/m.  BP Readings from Last 3 Encounters:  03/13/20 102/70    03/03/18 110/64  11/27/17 129/73    Wt Readings from Last 3 Encounters:  03/13/20 142 lb 9.6 oz (64.7 kg)  03/03/18 143 lb (64.9 kg)  02/24/17 138 lb (62.6 kg)     Physical Exam Constitutional: She appears well-developed and well-nourished. No distress.  HENT:  Head: Normocephalic and atraumatic.  Right Ear: External ear normal. Normal ear canal and TM Left Ear: External ear normal.  Normal ear canal and TM Mouth/Throat: Oropharynx is clear and moist.  Eyes: Conjunctivae and EOM are normal.  Neck: Neck supple. No tracheal deviation present. No thyromegaly present.  No carotid bruit  Cardiovascular: Normal rate, regular rhythm and normal heart sounds.   No murmur heard.  No edema. Pulmonary/Chest: Effort normal and breath sounds normal. No respiratory distress. She has no wheezes. She has no rales.  Breast: deferred   Abdominal: Soft. She exhibits no distension. There is no tenderness.  Lymphadenopathy: She has no cervical adenopathy.  Skin: Skin is warm and dry. She is not diaphoretic.  Psychiatric: She has a normal mood and affect. Her behavior is normal.        Assessment & Plan:   Physical exam: Screening blood work    ordered Immunizations  Up to date  Mammogram  Done this morning Gyn    Up to date  - Dr Julien Girt Exercise  Running, biking Weight  Good BMI Substance abuse  none  See Problem List for Assessment and Plan of chronic medical problems.   This visit occurred during the SARS-CoV-2 public health emergency.  Safety protocols were in place, including screening questions prior to the visit, additional usage of staff PPE, and extensive cleaning of exam room while observing appropriate contact time as indicated for disinfecting solutions.

## 2020-03-13 ENCOUNTER — Ambulatory Visit (INDEPENDENT_AMBULATORY_CARE_PROVIDER_SITE_OTHER): Payer: 59 | Admitting: Internal Medicine

## 2020-03-13 ENCOUNTER — Encounter: Payer: Self-pay | Admitting: Internal Medicine

## 2020-03-13 ENCOUNTER — Other Ambulatory Visit: Payer: Self-pay

## 2020-03-13 ENCOUNTER — Encounter: Payer: 59 | Admitting: Internal Medicine

## 2020-03-13 VITALS — BP 102/70 | HR 67 | Temp 98.1°F | Ht 61.75 in | Wt 142.6 lb

## 2020-03-13 DIAGNOSIS — R7303 Prediabetes: Secondary | ICD-10-CM | POA: Diagnosis not present

## 2020-03-13 DIAGNOSIS — Z0000 Encounter for general adult medical examination without abnormal findings: Secondary | ICD-10-CM

## 2020-03-13 DIAGNOSIS — J302 Other seasonal allergic rhinitis: Secondary | ICD-10-CM

## 2020-03-13 DIAGNOSIS — Z1231 Encounter for screening mammogram for malignant neoplasm of breast: Secondary | ICD-10-CM | POA: Diagnosis not present

## 2020-03-13 DIAGNOSIS — J3089 Other allergic rhinitis: Secondary | ICD-10-CM | POA: Diagnosis not present

## 2020-03-13 NOTE — Assessment & Plan Note (Signed)
Chronic Year round Takes otc meds prn - spring, fall

## 2020-03-13 NOTE — Assessment & Plan Note (Signed)
Chronic Check a1c Low sugar / carb diet Stressed regular exercise  

## 2020-04-22 MED FILL — LO LOESTRIN FE 1-10 TABLET: 1 MG-10 MCG | 28 days supply | Qty: 28 | Fill #0

## 2020-04-24 ENCOUNTER — Other Ambulatory Visit (HOSPITAL_COMMUNITY): Payer: Self-pay | Admitting: Obstetrics and Gynecology

## 2020-05-19 MED FILL — LO LOESTRIN FE 1-10 TABLET: 1 MG-10 MCG | 28 days supply | Qty: 28 | Fill #0

## 2020-06-13 MED FILL — LO LOESTRIN FE 1-10 TABLET: 1 MG-10 MCG | 28 days supply | Qty: 28 | Fill #1

## 2020-07-06 MED FILL — LO LOESTRIN FE 1-10 TABLET: 1 MG-10 MCG | 28 days supply | Qty: 28 | Fill #2

## 2020-08-07 MED FILL — LO LOESTRIN FE 1-10 TABLET: 1 MG-10 MCG | 28 days supply | Qty: 28 | Fill #3

## 2020-08-29 ENCOUNTER — Other Ambulatory Visit (INDEPENDENT_AMBULATORY_CARE_PROVIDER_SITE_OTHER): Payer: 59

## 2020-08-29 DIAGNOSIS — R7303 Prediabetes: Secondary | ICD-10-CM

## 2020-08-29 DIAGNOSIS — Z0000 Encounter for general adult medical examination without abnormal findings: Secondary | ICD-10-CM | POA: Diagnosis not present

## 2020-08-29 LAB — CBC WITH DIFFERENTIAL/PLATELET
Basophils Absolute: 0 10*3/uL (ref 0.0–0.1)
Basophils Relative: 0.7 % (ref 0.0–3.0)
Eosinophils Absolute: 0.1 10*3/uL (ref 0.0–0.7)
Eosinophils Relative: 1.3 % (ref 0.0–5.0)
HCT: 42.3 % (ref 36.0–46.0)
Hemoglobin: 14.4 g/dL (ref 12.0–15.0)
Lymphocytes Relative: 22.2 % (ref 12.0–46.0)
Lymphs Abs: 1.6 10*3/uL (ref 0.7–4.0)
MCHC: 33.9 g/dL (ref 30.0–36.0)
MCV: 87.5 fl (ref 78.0–100.0)
Monocytes Absolute: 0.4 10*3/uL (ref 0.1–1.0)
Monocytes Relative: 5.1 % (ref 3.0–12.0)
Neutro Abs: 5.2 10*3/uL (ref 1.4–7.7)
Neutrophils Relative %: 70.7 % (ref 43.0–77.0)
Platelets: 312 10*3/uL (ref 150.0–400.0)
RBC: 4.83 Mil/uL (ref 3.87–5.11)
RDW: 12.1 % (ref 11.5–15.5)
WBC: 7.4 10*3/uL (ref 4.0–10.5)

## 2020-08-29 LAB — LIPID PANEL
Cholesterol: 210 mg/dL — ABNORMAL HIGH (ref 0–200)
HDL: 63.1 mg/dL (ref >39.00)
LDL Cholesterol: 123 mg/dL — ABNORMAL HIGH (ref 0–99)
NonHDL: 146.63
Total CHOL/HDL Ratio: 3
Triglycerides: 119 mg/dL (ref 0.0–149.0)
VLDL: 23.8 mg/dL (ref 0.0–40.0)

## 2020-08-29 LAB — COMPREHENSIVE METABOLIC PANEL
ALT: 19 U/L (ref 0–35)
AST: 17 U/L (ref 0–37)
Albumin: 4 g/dL (ref 3.5–5.2)
Alkaline Phosphatase: 44 U/L (ref 39–117)
BUN: 15 mg/dL (ref 6–23)
CO2: 23 mEq/L (ref 19–32)
Calcium: 8.9 mg/dL (ref 8.4–10.5)
Chloride: 101 mEq/L (ref 96–112)
Creatinine, Ser: 0.79 mg/dL (ref 0.40–1.20)
GFR: 92.64 mL/min (ref >60.00)
Glucose, Bld: 95 mg/dL (ref 70–99)
Potassium: 4.2 mEq/L (ref 3.5–5.1)
Sodium: 135 mEq/L (ref 135–145)
Total Bilirubin: 0.4 mg/dL (ref 0.2–1.2)
Total Protein: 7.4 g/dL (ref 6.0–8.3)

## 2020-08-29 LAB — TSH: TSH: 4.16 u[IU]/mL (ref 0.35–4.50)

## 2020-08-29 LAB — HEMOGLOBIN A1C: Hgb A1c MFr Bld: 5.7 % (ref 4.6–6.5)

## 2020-09-05 MED FILL — LO LOESTRIN FE 1-10 TABLET: 1 MG-10 MCG | 28 days supply | Qty: 28 | Fill #4

## 2020-10-06 MED FILL — LO LOESTRIN FE 1-10 TABLET: 1 MG-10 MCG | 28 days supply | Qty: 28 | Fill #5

## 2020-11-03 MED FILL — LO LOESTRIN FE 1-10 TABLET: 1 MG-10 MCG | 28 days supply | Qty: 28 | Fill #6

## 2020-12-22 ENCOUNTER — Other Ambulatory Visit: Payer: Self-pay | Admitting: Obstetrics and Gynecology

## 2020-12-22 ENCOUNTER — Other Ambulatory Visit (HOSPITAL_COMMUNITY): Payer: Self-pay

## 2020-12-29 ENCOUNTER — Other Ambulatory Visit (HOSPITAL_COMMUNITY): Payer: Self-pay

## 2020-12-29 ENCOUNTER — Other Ambulatory Visit: Payer: Self-pay | Admitting: Obstetrics and Gynecology

## 2021-01-09 ENCOUNTER — Other Ambulatory Visit (HOSPITAL_COMMUNITY): Payer: Self-pay

## 2021-01-09 MED ORDER — NORETHIN-ETH ESTRAD-FE BIPHAS 1 MG-10 MCG / 10 MCG PO TABS
1.0000 | ORAL_TABLET | Freq: Every day | ORAL | 5 refills | Status: DC
  Filled 2021-01-09: qty 28, 28d supply, fill #0

## 2021-01-16 ENCOUNTER — Other Ambulatory Visit (HOSPITAL_COMMUNITY): Payer: Self-pay

## 2021-01-16 DIAGNOSIS — Z6827 Body mass index (BMI) 27.0-27.9, adult: Secondary | ICD-10-CM | POA: Diagnosis not present

## 2021-01-16 DIAGNOSIS — Z01419 Encounter for gynecological examination (general) (routine) without abnormal findings: Secondary | ICD-10-CM | POA: Diagnosis not present

## 2021-01-16 MED ORDER — LO LOESTRIN FE 1 MG-10 MCG / 10 MCG PO TABS
1.0000 | ORAL_TABLET | Freq: Every day | ORAL | 4 refills | Status: DC
  Filled 2021-01-16: qty 84, 84d supply, fill #0

## 2021-01-17 ENCOUNTER — Other Ambulatory Visit (HOSPITAL_COMMUNITY): Payer: Self-pay

## 2022-01-01 ENCOUNTER — Encounter: Payer: Self-pay | Admitting: Internal Medicine

## 2022-01-01 NOTE — Progress Notes (Signed)
? ? ?Subjective:  ? ? Patient ID: Breanna Gonzalez, female    DOB: [birth year], 41 y.o.   MRN: [id number] ? ? ?This visit occurred during the SARS-CoV-2 public health emergency.  Safety protocols were in place, including screening questions prior to the visit, additional usage of staff PPE, and extensive cleaning of exam room while observing appropriate contact time as indicated for disinfecting solutions. ? ? ? ?HPI ?Breanna Gonzalez is here for  ?Chief Complaint  ?Patient presents with  ? Annual Exam  ? ? ? ?Doing well - no changes.   No concerns.  ? ? ?Medications and allergies reviewed with patient and updated if appropriate. ? ? ? ?Current Outpatient Medications on File Prior to Visit  ?Medication Sig Dispense Refill  ? cetirizine (ZYRTEC) 10 MG tablet Take 10 mg by mouth daily as needed.    ? tretinoin (RETIN-A) 0.025 % cream 1 application to affected area in the evening to face    ? Triamcinolone Acetonide (NASACORT ALLERGY 24HR) 55 MCG/ACT AERO Place into the nose daily.    ? ?No current facility-administered medications on file prior to visit.  ? ? ?Review of Systems  ?Constitutional:  Negative for fever.  ?Eyes:  Negative for visual disturbance.  ?Respiratory:  Negative for cough, shortness of breath and wheezing.   ?Cardiovascular:  Negative for chest pain, palpitations and leg swelling.  ?Gastrointestinal:  Negative for abdominal pain, blood in stool, constipation, diarrhea and nausea.  ?     No gerd  ?Genitourinary:  Negative for dysuria.  ?Musculoskeletal:  Negative for arthralgias and back pain.  ?     Achilles tendinitis  ?Skin:  Negative for rash.  ?Neurological:  Negative for light-headedness and headaches.  ?Psychiatric/Behavioral:  Negative for dysphoric mood. The patient is not nervous/anxious.   ? ?   ?Objective:  ? ?Vitals:  ? 01/02/22 1018  ?BP: 108/72  ?Pulse: 65  ?Temp: 98.4 ?F (36.9 ?C)  ?SpO2: 98%  ? ?Filed Weights  ? 01/02/22 1018  ?Weight: 144 lb (65.3 kg)  ? ?Body mass index is 26.55 kg/m?. ? ?BP  Readings from Last 3 Encounters:  ?01/02/22 108/72  ?03/13/20 102/70  ?03/03/18 110/64  ? ? ?Wt Readings from Last 3 Encounters:  ?01/02/22 144 lb (65.3 kg)  ?03/13/20 142 lb 9.6 oz (64.7 kg)  ?03/03/18 143 lb (64.9 kg)  ? ? ? ?  03/13/2020  ?  3:36 PM 03/03/2018  ?  7:54 AM  ?Depression screen PHQ 2/9  ?Decreased Interest 0 0  ?Down, Depressed, Hopeless 0 0  ?PHQ - 2 Score 0 0  ? ? ? ?   ? View : No data to display.  ?  ?  ?  ? ? ? ? ?  ?Physical Exam ?Constitutional: She appears well-developed and well-nourished. No distress.  ?HENT:  ?Head: Normocephalic and atraumatic.  ?Right Ear: External ear normal. Normal ear canal and TM ?Left Ear: External ear normal.  Normal ear canal and TM ?Mouth/Throat: Oropharynx is clear and moist.  ?Eyes: Conjunctivae and EOM are normal.  ?Neck: Neck supple. No tracheal deviation present. No thyromegaly present.  ?No carotid bruit  ?Cardiovascular: Normal rate, regular rhythm and normal heart sounds.   ?No murmur heard.  No edema. ?Pulmonary/Chest: Effort normal and breath sounds normal. No respiratory distress. She has no wheezes. She has no rales.  ?Breast: deferred   ?Abdominal: Soft. She exhibits no distension. There is no tenderness.  ?Lymphadenopathy: She has no cervical adenopathy.  ?Skin: Skin is  warm and dry. She is not diaphoretic.  ?Psychiatric: She has a normal mood and affect. Her behavior is normal.  ? ? ? ?Lab Results  ?Component Value Date  ? WBC 7.4 08/29/2020  ? HGB 14.4 08/29/2020  ? HCT 42.3 08/29/2020  ? PLT 312.0 08/29/2020  ? GLUCOSE 95 08/29/2020  ? CHOL 210 (H) 08/29/2020  ? TRIG 119.0 08/29/2020  ? HDL 63.10 08/29/2020  ? LDLCALC 123 (H) 08/29/2020  ? ALT 19 08/29/2020  ? AST 17 08/29/2020  ? NA 135 08/29/2020  ? K 4.2 08/29/2020  ? CL 101 08/29/2020  ? CREATININE 0.79 08/29/2020  ? BUN 15 08/29/2020  ? CO2 23 08/29/2020  ? TSH 4.16 08/29/2020  ? HGBA1C 5.7 08/29/2020  ? ? ? ? ?   ?Assessment & Plan:  ? ?Physical exam: ?Screening blood work  ordered ?Exercise  running, mountain biking ?Weight mildly overweight mildly overweight ?Substance abuse  none ? ? ?Reviewed recommended immunizations. ? ? ?Health Maintenance  ?Topic Date Due  ? MAMMOGRAM  Never done  ? INFLUENZA VACCINE  04/16/2022  ? PAP SMEAR-Modifier  03/06/2023  ? TETANUS/TDAP  12/17/2025  ? HIV Screening  Completed  ? HPV VACCINES  Aged Out  ? Hepatitis C Screening  Discontinued  ?  ? ?Mammo scheduled.  ? ? ? ? ?See Problem List for Assessment and Plan of chronic medical problems. ? ? ? ? ?

## 2022-01-01 NOTE — Patient Instructions (Addendum)
? ? ? ?Blood work was ordered.   ? ? ? ?Medications changes include : None ? ? ? ? ?Return in about 1 year (around 01/03/2023) for Physical Exam. ? ? ? ?Health Maintenance, Female ?Adopting a healthy lifestyle and getting preventive care are important in promoting health and wellness. Ask your health care provider about: ?The right schedule for you to have regular tests and exams. ?Things you can do on your own to prevent diseases and keep yourself healthy. ?What should I know about diet, weight, and exercise? ?Eat a healthy diet ? ?Eat a diet that includes plenty of vegetables, fruits, low-fat dairy products, and lean protein. ?Do not eat a lot of foods that are high in solid fats, added sugars, or sodium. ?Maintain a healthy weight ?Body mass index (BMI) is used to identify weight problems. It estimates body fat based on height and weight. Your health care provider can help determine your BMI and help you achieve or maintain a healthy weight. ?Get regular exercise ?Get regular exercise. This is one of the most important things you can do for your health. Most adults should: ?Exercise for at least 150 minutes each week. The exercise should increase your heart rate and make you sweat (moderate-intensity exercise). ?Do strengthening exercises at least twice a week. This is in addition to the moderate-intensity exercise. ?Spend less time sitting. Even light physical activity can be beneficial. ?Watch cholesterol and blood lipids ?Have your blood tested for lipids and cholesterol at 43 years of age, then have this test every 5 years. ?Have your cholesterol levels checked more often if: ?Your lipid or cholesterol levels are high. ?You are older than 43 years of age. ?You are at high risk for heart disease. ?What should I know about cancer screening? ?Depending on your health history and family history, you may need to have cancer screening at various ages. This may include screening for: ?Breast cancer. ?Cervical  cancer. ?Colorectal cancer. ?Skin cancer. ?Lung cancer. ?What should I know about heart disease, diabetes, and high blood pressure? ?Blood pressure and heart disease ?High blood pressure causes heart disease and increases the risk of stroke. This is more likely to develop in people who have high blood pressure readings or are overweight. ?Have your blood pressure checked: ?Every 3-5 years if you are 50-1 years of age. ?Every year if you are 27 years old or older. ?Diabetes ?Have regular diabetes screenings. This checks your fasting blood sugar level. Have the screening done: ?Once every three years after age 67 if you are at a normal weight and have a low risk for diabetes. ?More often and at a younger age if you are overweight or have a high risk for diabetes. ?What should I know about preventing infection? ?Hepatitis B ?If you have a higher risk for hepatitis B, you should be screened for this virus. Talk with your health care provider to find out if you are at risk for hepatitis B infection. ?Hepatitis C ?Testing is recommended for: ?Everyone born from [birth year] through 1965. ?Anyone with known risk factors for hepatitis C. ?Sexually transmitted infections (STIs) ?Get screened for STIs, including gonorrhea and chlamydia, if: ?You are sexually active and are younger than 43 years of age. ?You are older than 43 years of age and your health care provider tells you that you are at risk for this type of infection. ?Your sexual activity has changed since you were last screened, and you are at increased risk for chlamydia or gonorrhea. Ask your health  care provider if you are at risk. ?Ask your health care provider about whether you are at high risk for HIV. Your health care provider may recommend a prescription medicine to help prevent HIV infection. If you choose to take medicine to prevent HIV, you should first get tested for HIV. You should then be tested every 3 months for as long as you are taking the  medicine. ?Pregnancy ?If you are about to stop having your period (premenopausal) and you may become pregnant, seek counseling before you get pregnant. ?Take 400 to 800 micrograms (mcg) of folic acid every day if you become pregnant. ?Ask for birth control (contraception) if you want to prevent pregnancy. ?Osteoporosis and menopause ?Osteoporosis is a disease in which the bones lose minerals and strength with aging. This can result in bone fractures. If you are 66 years old or older, or if you are at risk for osteoporosis and fractures, ask your health care provider if you should: ?Be screened for bone loss. ?Take a calcium or vitamin D supplement to lower your risk of fractures. ?Be given hormone replacement therapy (HRT) to treat symptoms of menopause. ?Follow these instructions at home: ?Alcohol use ?Do not drink alcohol if: ?Your health care provider tells you not to drink. ?You are pregnant, may be pregnant, or are planning to become pregnant. ?If you drink alcohol: ?Limit how much you have to: ?0-1 drink a day. ?Know how much alcohol is in your drink. In the [place] one drink equals one 12 oz bottle of beer (355 mL), one 5 oz glass of wine (148 mL), or one 1? oz glass of hard liquor (44 mL). ?Lifestyle ?Do not use any products that contain nicotine or tobacco. These products include cigarettes, chewing tobacco, and vaping devices, such as e-cigarettes. If you need help quitting, ask your health care provider. ?Do not use street drugs. ?Do not share needles. ?Ask your health care provider for help if you need support or information about quitting drugs. ?General instructions ?Schedule regular health, dental, and eye exams. ?Stay current with your vaccines. ?Tell your health care provider if: ?You often feel depressed. ?You have ever been abused or do not feel safe at home. ?Summary ?Adopting a healthy lifestyle and getting preventive care are important in promoting health and wellness. ?Follow your health care  provider's instructions about healthy diet, exercising, and getting tested or screened for diseases. ?Follow your health care provider's instructions on monitoring your cholesterol and blood pressure. ?This information is not intended to replace advice given to you by your health care provider. Make sure you discuss any questions you have with your health care provider. ?Document Revised: 01/22/2021 Document Reviewed: 01/22/2021 ?Elsevier Patient Education ? [place]. ? ?

## 2022-01-02 ENCOUNTER — Ambulatory Visit (INDEPENDENT_AMBULATORY_CARE_PROVIDER_SITE_OTHER): Payer: 59 | Admitting: Internal Medicine

## 2022-01-02 VITALS — BP 108/72 | HR 65 | Temp 98.4°F | Ht 61.75 in | Wt 144.0 lb

## 2022-01-02 DIAGNOSIS — Z0000 Encounter for general adult medical examination without abnormal findings: Secondary | ICD-10-CM | POA: Diagnosis not present

## 2022-01-02 DIAGNOSIS — R7303 Prediabetes: Secondary | ICD-10-CM

## 2022-01-02 DIAGNOSIS — J302 Other seasonal allergic rhinitis: Secondary | ICD-10-CM

## 2022-01-02 DIAGNOSIS — J3089 Other allergic rhinitis: Secondary | ICD-10-CM | POA: Diagnosis not present

## 2022-01-02 NOTE — Assessment & Plan Note (Signed)
Chronic Check a1c Low sugar / carb diet Stressed regular exercise  

## 2022-01-02 NOTE — Assessment & Plan Note (Signed)
Chronic ?Taking medication year round ?Fairly controlled ?

## 2022-02-01 ENCOUNTER — Other Ambulatory Visit (INDEPENDENT_AMBULATORY_CARE_PROVIDER_SITE_OTHER): Payer: 59

## 2022-02-01 DIAGNOSIS — R7303 Prediabetes: Secondary | ICD-10-CM | POA: Diagnosis not present

## 2022-02-01 DIAGNOSIS — Z0000 Encounter for general adult medical examination without abnormal findings: Secondary | ICD-10-CM | POA: Diagnosis not present

## 2022-02-01 LAB — CBC WITH DIFFERENTIAL/PLATELET
Basophils Absolute: 0 10*3/uL (ref 0.0–0.1)
Basophils Relative: 0.7 % (ref 0.0–3.0)
Eosinophils Absolute: 0.1 10*3/uL (ref 0.0–0.7)
Eosinophils Relative: 2.6 % (ref 0.0–5.0)
HCT: 43 % (ref 36.0–46.0)
Hemoglobin: 14.3 g/dL (ref 12.0–15.0)
Lymphocytes Relative: 30.1 % (ref 12.0–46.0)
Lymphs Abs: 1.6 10*3/uL (ref 0.7–4.0)
MCHC: 33.3 g/dL (ref 30.0–36.0)
MCV: 89.7 fl (ref 78.0–100.0)
Monocytes Absolute: 0.4 10*3/uL (ref 0.1–1.0)
Monocytes Relative: 8.2 % (ref 3.0–12.0)
Neutro Abs: 3 10*3/uL (ref 1.4–7.7)
Neutrophils Relative %: 58.4 % (ref 43.0–77.0)
Platelets: 275 10*3/uL (ref 150.0–400.0)
RBC: 4.79 Mil/uL (ref 3.87–5.11)
RDW: 12.4 % (ref 11.5–15.5)
WBC: 5.2 10*3/uL (ref 4.0–10.5)

## 2022-02-01 LAB — LIPID PANEL
Cholesterol: 182 mg/dL (ref 0–200)
HDL: 75.7 mg/dL (ref >39.00)
LDL Cholesterol: 89 mg/dL (ref 0–99)
NonHDL: 106.33
Total CHOL/HDL Ratio: 2
Triglycerides: 85 mg/dL (ref 0.0–149.0)
VLDL: 17 mg/dL (ref 0.0–40.0)

## 2022-02-01 LAB — TSH: TSH: 1.98 u[IU]/mL (ref 0.35–5.50)

## 2022-02-01 LAB — COMPREHENSIVE METABOLIC PANEL
ALT: 16 U/L (ref 0–35)
AST: 14 U/L (ref 0–37)
Albumin: 4.2 g/dL (ref 3.5–5.2)
Alkaline Phosphatase: 69 U/L (ref 39–117)
BUN: 17 mg/dL (ref 6–23)
CO2: 27 mEq/L (ref 19–32)
Calcium: 9.2 mg/dL (ref 8.4–10.5)
Chloride: 103 mEq/L (ref 96–112)
Creatinine, Ser: 0.71 mg/dL (ref 0.40–1.20)
GFR: 104.25 mL/min (ref >60.00)
Glucose, Bld: 103 mg/dL — ABNORMAL HIGH (ref 70–99)
Potassium: 4 mEq/L (ref 3.5–5.1)
Sodium: 139 mEq/L (ref 135–145)
Total Bilirubin: 0.6 mg/dL (ref 0.2–1.2)
Total Protein: 7.2 g/dL (ref 6.0–8.3)

## 2022-02-01 LAB — HEMOGLOBIN A1C: Hgb A1c MFr Bld: 5.6 % (ref 4.6–6.5)

## 2022-02-21 DIAGNOSIS — Z1231 Encounter for screening mammogram for malignant neoplasm of breast: Secondary | ICD-10-CM | POA: Diagnosis not present

## 2022-02-21 DIAGNOSIS — Z6827 Body mass index (BMI) 27.0-27.9, adult: Secondary | ICD-10-CM | POA: Diagnosis not present

## 2022-02-21 DIAGNOSIS — Z01419 Encounter for gynecological examination (general) (routine) without abnormal findings: Secondary | ICD-10-CM | POA: Diagnosis not present

## 2022-05-06 ENCOUNTER — Other Ambulatory Visit (HOSPITAL_COMMUNITY): Payer: Self-pay

## 2022-05-06 MED ORDER — TRETINOIN 0.05 % EX CREA
TOPICAL_CREAM | Freq: Every evening | CUTANEOUS | 3 refills | Status: AC
  Filled 2022-05-06: qty 20, 30d supply, fill #0
  Filled 2022-05-28: qty 20, 14d supply, fill #0
  Filled 2022-06-14: qty 20, 30d supply, fill #0

## 2022-05-28 ENCOUNTER — Other Ambulatory Visit (HOSPITAL_COMMUNITY): Payer: Self-pay

## 2022-06-14 ENCOUNTER — Other Ambulatory Visit (HOSPITAL_COMMUNITY): Payer: Self-pay

## 2022-06-17 ENCOUNTER — Other Ambulatory Visit (HOSPITAL_COMMUNITY): Payer: Self-pay

## 2022-09-12 ENCOUNTER — Other Ambulatory Visit (HOSPITAL_COMMUNITY): Payer: Self-pay

## 2022-09-12 ENCOUNTER — Encounter: Payer: Self-pay | Admitting: Pulmonary Disease

## 2022-09-12 ENCOUNTER — Telehealth (INDEPENDENT_AMBULATORY_CARE_PROVIDER_SITE_OTHER): Payer: 59 | Admitting: Pulmonary Disease

## 2022-09-12 DIAGNOSIS — J069 Acute upper respiratory infection, unspecified: Secondary | ICD-10-CM | POA: Diagnosis not present

## 2022-09-12 DIAGNOSIS — R0989 Other specified symptoms and signs involving the circulatory and respiratory systems: Secondary | ICD-10-CM

## 2022-09-12 DIAGNOSIS — R058 Other specified cough: Secondary | ICD-10-CM | POA: Diagnosis not present

## 2022-09-12 DIAGNOSIS — R052 Subacute cough: Secondary | ICD-10-CM

## 2022-09-12 MED ORDER — BENZONATATE 200 MG PO CAPS
200.0000 mg | ORAL_CAPSULE | Freq: Three times a day (TID) | ORAL | 1 refills | Status: DC | PRN
  Filled 2022-09-12: qty 60, 20d supply, fill #0

## 2022-09-12 MED ORDER — METHYLPREDNISOLONE 4 MG PO TBPK
ORAL_TABLET | ORAL | 0 refills | Status: DC
  Filled 2022-09-12: qty 21, 6d supply, fill #0

## 2022-09-12 NOTE — Patient Instructions (Signed)
Thank you for visiting Dr. Tonia Brooms at [facility]. Today we recommend the following:  Meds ordered this encounter  Medications   methylPREDNISolone (MEDROL DOSEPAK) 4 MG TBPK tablet    Sig: Take per pack directions    Dispense:  21 each    Refill:  0   benzonatate (TESSALON) 200 MG capsule    Sig: Take 1 capsule (200 mg total) by mouth 3 (three) times daily as needed for cough.    Dispense:  60 capsule    Refill:  1   Return if symptoms worsen or fail to improve.    Please do your part to reduce the spread of COVID-19.

## 2022-09-12 NOTE — Progress Notes (Signed)
Virtual Visit via Video Note  I connected with Breanna Gonzalez on 09/12/22 at 10:00 AM EST by a video enabled telemedicine application and verified that I am speaking with the correct person using two identifiers.  Location: Patient: Work Provider: Office   I discussed the limitations of evaluation and management by telemedicine and the availability of in person appointments. The patient expressed understanding and agreed to proceed.  History of Present Illness:  This is a 41 year old female, past medical history of allergic rhinitis, history of urticaria.  Presents today via video visit with complaints of ongoing cough congestion sputum production.  Approximately 2 weeks ago developed upper respiratory tract symptoms with fever body aches malaise.  She tested at home for COVID-19 which was negative.  She did not have a flu test.  She did receive her flu vaccine this year.  She is up-to-date on her COVID-19 vaccinations.  She works in [employer] and has been exposed to other patients that are positive for influenza as well as COVID or RSV.  However for the past several days she has had ongoing cough congestion chest tightness.  She states that usually after an upper respiratory tract infection she has a cough that lingers for several weeks.  Past Medical History:  Diagnosis Date   ALLERGIC RHINITIS    History of chicken pox    Perennial allergic rhinitis with seasonal variation 11/17/2011   Allergy skin testing positive 11/13/11    Spina bifida occulta    Urticaria     Observations/Objective:  Met today via video visit.  Patient in no acute distress.  Audible coughing with discussions.  Assessment and Plan:  Status post upper respiratory tract infection postviral cough syndrome  Plan: Steroid taper pack, 4 mg Medrol Dosepak. She is going to let me know if she continues to have ongoing symptoms. She may benefit from a ICS/LABA inhaler. If this goes on we will set aside samples for  her in the office to pick up. Going to hold off on antibiotics at this time.  But if patient's fever reoccurs we will start her on antibiotics.  She is going to call and let us know.  Follow Up Instructions:  Patient to give [place] a call back if she is having ongoing symptoms.   I discussed the assessment and treatment plan with the patient. The patient was provided an opportunity to ask questions and all were answered. The patient agreed with the plan and demonstrated an understanding of the instructions.   The patient was advised to call back or seek an in-person evaluation if the symptoms worsen or if the condition fails to improve as anticipated.  I provided 12 minutes of non-face-to-face time during this encounter.   Garner Nash, DO

## 2023-02-25 DIAGNOSIS — Z01419 Encounter for gynecological examination (general) (routine) without abnormal findings: Secondary | ICD-10-CM | POA: Diagnosis not present

## 2023-02-25 DIAGNOSIS — Z6827 Body mass index (BMI) 27.0-27.9, adult: Secondary | ICD-10-CM | POA: Diagnosis not present

## 2023-02-25 DIAGNOSIS — Z1231 Encounter for screening mammogram for malignant neoplasm of breast: Secondary | ICD-10-CM | POA: Diagnosis not present

## 2023-04-16 ENCOUNTER — Encounter (INDEPENDENT_AMBULATORY_CARE_PROVIDER_SITE_OTHER): Payer: Self-pay

## 2023-05-12 ENCOUNTER — Encounter: Payer: Self-pay | Admitting: Internal Medicine

## 2023-05-12 NOTE — Progress Notes (Unsigned)
Subjective:    Patient ID: Breanna Gonzalez, female    DOB: 1981-02-17, 42 y.o.   MRN: [id number]      HPI Breanna Gonzalez is here for a Physical exam and her chronic medical problems.    Overall doing well.  No concerns.  She has a new job which is going well.  Medications and allergies reviewed with patient and updated if appropriate.  Current Outpatient Medications on File Prior to Visit  Medication Sig Dispense Refill   tretinoin (RETIN-A) 0.05 % cream Apply one application to affected area in the evening to face once a day. 20 g 3   Triamcinolone Acetonide (NASACORT ALLERGY 24HR) 55 MCG/ACT AERO Place into the nose daily.     No current facility-administered medications on file prior to visit.    Review of Systems  Constitutional:  Negative for fever.  Eyes:  Negative for visual disturbance.  Respiratory:  Negative for cough, shortness of breath and wheezing.   Cardiovascular:  Negative for chest pain, palpitations and leg swelling.  Gastrointestinal:  Negative for abdominal pain, blood in stool, constipation and diarrhea.       No gerd  Genitourinary:  Negative for dysuria.  Musculoskeletal:  Negative for arthralgias and back pain.  Skin:  Negative for rash.  Neurological:  Negative for dizziness, light-headedness and headaches.  Psychiatric/Behavioral:  Negative for dysphoric mood and sleep disturbance. The patient is not nervous/anxious.        Objective:   Vitals:   05/13/23 0817  BP: 104/68  Pulse: 61  Temp: 98.3 F (36.8 C)  SpO2: 98%   Filed Weights   05/13/23 0817  Weight: 147 lb (66.7 kg)   Body mass index is 27.1 kg/m.  BP Readings from Last 3 Encounters:  05/13/23 104/68  01/02/22 108/72  03/13/20 102/70    Wt Readings from Last 3 Encounters:  05/13/23 147 lb (66.7 kg)  01/02/22 144 lb (65.3 kg)  03/13/20 142 lb 9.6 oz (64.7 kg)       Physical Exam Constitutional: She appears well-developed and well-nourished. No distress.  HENT:   Head: Normocephalic and atraumatic.  Right Ear: External ear normal. Normal ear canal and TM Left Ear: External ear normal.  Normal ear canal and TM Mouth/Throat: Oropharynx is clear and moist.  Eyes: Conjunctivae normal.  Neck: Neck supple. No tracheal deviation present. No thyromegaly present.  No carotid bruit  Cardiovascular: Normal rate, regular rhythm and normal heart sounds.   No murmur heard.  No edema. Pulmonary/Chest: Effort normal and breath sounds normal. No respiratory distress. She has no wheezes. She has no rales.  Breast: deferred   Abdominal: Soft. She exhibits no distension. There is no tenderness.  Lymphadenopathy: She has no cervical adenopathy.  Skin: Skin is warm and dry. She is not diaphoretic.  Psychiatric: She has a normal mood and affect. Her behavior is normal.     Lab Results  Component Value Date   WBC 5.2 02/01/2022   HGB 14.3 02/01/2022   HCT 43.0 02/01/2022   PLT 275.0 02/01/2022   GLUCOSE 103 (H) 02/01/2022   CHOL 182 02/01/2022   TRIG 85.0 02/01/2022   HDL 75.70 02/01/2022   LDLCALC 89 02/01/2022   ALT 16 02/01/2022   AST 14 02/01/2022   NA 139 02/01/2022   K 4.0 02/01/2022   CL 103 02/01/2022   CREATININE 0.71 02/01/2022   BUN 17 02/01/2022   CO2 27 02/01/2022   TSH 1.98 02/01/2022   HGBA1C 5.6  02/01/2022         Assessment & Plan:   Physical exam: Screening blood work  ordered Exercise regular-running, mountain biking Weight normal Substance abuse  none   Reviewed recommended immunizations.   Health Maintenance  Topic Date Due   MAMMOGRAM  Never done   COVID-19 Vaccine (1 - 2023-24 season) Never done   PAP SMEAR-Modifier  03/06/2023   INFLUENZA VACCINE  07/18/2023 (Originally 04/17/2023)   DTaP/Tdap/Td (3 - Td or Tdap) 12/17/2025   HIV Screening  Completed   HPV VACCINES  Aged Out   Hepatitis C Screening  Discontinued          See Problem List for Assessment and Plan of chronic medical problems.

## 2023-05-12 NOTE — Patient Instructions (Addendum)

## 2023-05-13 ENCOUNTER — Ambulatory Visit: Payer: 59 | Admitting: Internal Medicine

## 2023-05-13 VITALS — BP 104/68 | HR 61 | Temp 98.3°F | Ht 61.75 in | Wt 147.0 lb

## 2023-05-13 DIAGNOSIS — Z0000 Encounter for general adult medical examination without abnormal findings: Secondary | ICD-10-CM | POA: Diagnosis not present

## 2023-05-13 DIAGNOSIS — R7303 Prediabetes: Secondary | ICD-10-CM

## 2023-05-13 NOTE — Assessment & Plan Note (Addendum)
Chronic Check a1c CBC, CMP, lipid, TSH Low sugar / carb diet Stressed regular exercise

## 2024-02-12 ENCOUNTER — Other Ambulatory Visit (HOSPITAL_COMMUNITY): Payer: Self-pay

## 2024-02-12 MED ORDER — ETONOGESTREL-ETHINYL ESTRADIOL 0.12-0.015 MG/24HR VA RING
1.0000 | VAGINAL_RING | VAGINAL | 0 refills | Status: DC
  Filled 2024-02-12: qty 3, 84d supply, fill #0

## 2024-03-22 ENCOUNTER — Other Ambulatory Visit (HOSPITAL_COMMUNITY): Payer: Self-pay

## 2024-03-22 DIAGNOSIS — Z1231 Encounter for screening mammogram for malignant neoplasm of breast: Secondary | ICD-10-CM | POA: Diagnosis not present

## 2024-03-22 DIAGNOSIS — Z1151 Encounter for screening for human papillomavirus (HPV): Secondary | ICD-10-CM | POA: Diagnosis not present

## 2024-03-22 DIAGNOSIS — Z01419 Encounter for gynecological examination (general) (routine) without abnormal findings: Secondary | ICD-10-CM | POA: Diagnosis not present

## 2024-03-22 DIAGNOSIS — Z6828 Body mass index (BMI) 28.0-28.9, adult: Secondary | ICD-10-CM | POA: Diagnosis not present

## 2024-03-22 DIAGNOSIS — Z124 Encounter for screening for malignant neoplasm of cervix: Secondary | ICD-10-CM | POA: Diagnosis not present

## 2024-03-22 MED ORDER — ETONOGESTREL-ETHINYL ESTRADIOL 0.12-0.015 MG/24HR VA RING
VAGINAL_RING | VAGINAL | 3 refills | Status: AC
  Filled 2024-04-19: qty 3, 30d supply, fill #0
  Filled 2024-04-20: qty 3, 84d supply, fill #0
  Filled 2024-07-11: qty 3, 84d supply, fill #1
  Filled 2024-10-03: qty 3, 84d supply, fill #2

## 2024-04-19 ENCOUNTER — Other Ambulatory Visit (HOSPITAL_COMMUNITY): Payer: Self-pay

## 2024-04-20 ENCOUNTER — Other Ambulatory Visit (HOSPITAL_COMMUNITY): Payer: Self-pay

## 2024-04-21 DIAGNOSIS — N852 Hypertrophy of uterus: Secondary | ICD-10-CM | POA: Diagnosis not present

## 2024-05-13 ENCOUNTER — Encounter: Payer: Self-pay | Admitting: Internal Medicine

## 2024-05-13 NOTE — Progress Notes (Unsigned)
 Subjective:    Patient ID: Breanna Gonzalez, female    DOB: [birth year], 43 y.o.   MRN: [id number]      HPI Breanna Gonzalez is here for a Physical exam and her chronic medical problems.    Doing well.    Medications and allergies reviewed with patient and updated if appropriate.  Current Outpatient Medications on File Prior to Visit  Medication Sig Dispense Refill   etonogestrel -ethinyl estradiol  (NUVARING) 0.12-0.015 MG/24HR vaginal ring Insert 1 vaginal ring every month by vaginal route. 3 each 3   tretinoin  (RETIN-A ) 0.05 % cream Apply one application to affected area in the evening to face once a day. 20 g 3   Triamcinolone Acetonide (NASACORT ALLERGY  24HR) 55 MCG/ACT AERO Place into the nose daily.     No current facility-administered medications on file prior to visit.    Review of Systems  Constitutional:  Negative for fever.  Eyes:  Negative for visual disturbance.  Respiratory:  Negative for cough, shortness of breath and wheezing.   Cardiovascular:  Negative for chest pain, palpitations and leg swelling.  Gastrointestinal:  Negative for abdominal pain, blood in stool, constipation and diarrhea.       No gerd  Genitourinary:  Negative for dysuria.  Musculoskeletal:  Negative for arthralgias and back pain.  Skin:  Negative for rash.  Neurological:  Negative for light-headedness and headaches.  Psychiatric/Behavioral:  Negative for dysphoric mood and sleep disturbance. The patient is not nervous/anxious.        Objective:   Vitals:   05/14/24 0749  BP: 100/68  Pulse: 68  Temp: 98.3 F (36.8 C)  SpO2: 98%   Filed Weights   05/14/24 0749  Weight: 146 lb (66.2 kg)   Body mass index is 26.92 kg/m.  BP Readings from Last 3 Encounters:  05/14/24 100/68  05/13/23 104/68  01/02/22 108/72    Wt Readings from Last 3 Encounters:  05/14/24 146 lb (66.2 kg)  05/13/23 147 lb (66.7 kg)  01/02/22 144 lb (65.3 kg)       Physical Exam Constitutional: She appears  well-developed and well-nourished. No distress.  HENT:  Head: Normocephalic and atraumatic.  Right Ear: External ear normal. Normal ear canal and TM Left Ear: External ear normal.  Normal ear canal and TM Mouth/Throat: Oropharynx is clear and moist.  Eyes: Conjunctivae normal.  Neck: Neck supple. No tracheal deviation present. No thyromegaly present.  No carotid bruit  Cardiovascular: Normal rate, regular rhythm and normal heart sounds.   No murmur heard.  No edema. Pulmonary/Chest: Effort normal and breath sounds normal. No respiratory distress. She has no wheezes. She has no rales.  Breast: deferred   Abdominal: Soft. She exhibits no distension. There is no tenderness.  Lymphadenopathy: She has no cervical adenopathy.  Skin: Skin is warm and dry. She is not diaphoretic.  Psychiatric: She has a normal mood and affect. Her behavior is normal.     Lab Results  Component Value Date   WBC 5.2 02/01/2022   HGB 14.3 02/01/2022   HCT 43.0 02/01/2022   PLT 275.0 02/01/2022   GLUCOSE 103 (H) 02/01/2022   CHOL 182 02/01/2022   TRIG 85.0 02/01/2022   HDL 75.70 02/01/2022   LDLCALC 89 02/01/2022   ALT 16 02/01/2022   AST 14 02/01/2022   NA 139 02/01/2022   K 4.0 02/01/2022   CL 103 02/01/2022   CREATININE 0.71 02/01/2022   BUN 17 02/01/2022   CO2 27 02/01/2022   TSH  1.98 02/01/2022   HGBA1C 5.6 02/01/2022         Assessment & Plan:   Physical exam: Screening blood work  ordered Exercise  regular Weight  is good Substance abuse  none    Reviewed recommended immunizations.   Health Maintenance  Topic Date Due   MAMMOGRAM  Never done   Hepatitis B Vaccines 19-59 Average Risk (1 of 3 - 19+ 3-dose series) Never done   HPV VACCINES (1 - 3-dose SCDM series) Never done   Cervical Cancer Screening (HPV/Pap Cotest)  03/05/2021   Colonoscopy  Never done   INFLUENZA VACCINE  04/16/2024   COVID-19 Vaccine (1 - 2024-25 season) 05/29/2024 (Originally 05/18/2023)   DTaP/Tdap/Td  (3 - Td or Tdap) 12/17/2025   HIV Screening  Completed   Pneumococcal Vaccine  Aged Out   Meningococcal B Vaccine  Aged Out   Hepatitis C Screening  Discontinued          See Problem List for Assessment and Plan of chronic medical problems.

## 2024-05-13 NOTE — Patient Instructions (Addendum)

## 2024-05-14 ENCOUNTER — Ambulatory Visit: Payer: 59 | Admitting: Internal Medicine

## 2024-05-14 ENCOUNTER — Encounter: Payer: Self-pay | Admitting: Pediatrics

## 2024-05-14 ENCOUNTER — Ambulatory Visit: Payer: Self-pay | Admitting: Internal Medicine

## 2024-05-14 VITALS — BP 100/68 | HR 68 | Temp 98.3°F | Ht 61.75 in | Wt 146.0 lb

## 2024-05-14 DIAGNOSIS — D259 Leiomyoma of uterus, unspecified: Secondary | ICD-10-CM | POA: Diagnosis not present

## 2024-05-14 DIAGNOSIS — Z0000 Encounter for general adult medical examination without abnormal findings: Secondary | ICD-10-CM | POA: Diagnosis not present

## 2024-05-14 DIAGNOSIS — R7303 Prediabetes: Secondary | ICD-10-CM

## 2024-05-14 LAB — CBC WITH DIFFERENTIAL/PLATELET
Basophils Absolute: 0 K/uL (ref 0.0–0.1)
Basophils Relative: 0.5 % (ref 0.0–3.0)
Eosinophils Absolute: 0.1 K/uL (ref 0.0–0.7)
Eosinophils Relative: 1.2 % (ref 0.0–5.0)
HCT: 41.4 % (ref 36.0–46.0)
Hemoglobin: 13.9 g/dL (ref 12.0–15.0)
Lymphocytes Relative: 27.1 % (ref 12.0–46.0)
Lymphs Abs: 1.6 K/uL (ref 0.7–4.0)
MCHC: 33.6 g/dL (ref 30.0–36.0)
MCV: 87.5 fl (ref 78.0–100.0)
Monocytes Absolute: 0.4 K/uL (ref 0.1–1.0)
Monocytes Relative: 6.2 % (ref 3.0–12.0)
Neutro Abs: 3.8 K/uL (ref 1.4–7.7)
Neutrophils Relative %: 65 % (ref 43.0–77.0)
Platelets: 311 K/uL (ref 150.0–400.0)
RBC: 4.73 Mil/uL (ref 3.87–5.11)
RDW: 12.2 % (ref 11.5–15.5)
WBC: 5.8 K/uL (ref 4.0–10.5)

## 2024-05-14 LAB — COMPREHENSIVE METABOLIC PANEL WITH GFR
ALT: 13 U/L (ref 0–35)
AST: 14 U/L (ref 0–37)
Albumin: 4.1 g/dL (ref 3.5–5.2)
Alkaline Phosphatase: 57 U/L (ref 39–117)
BUN: 15 mg/dL (ref 6–23)
CO2: 25 meq/L (ref 19–32)
Calcium: 9 mg/dL (ref 8.4–10.5)
Chloride: 102 meq/L (ref 96–112)
Creatinine, Ser: 0.7 mg/dL (ref 0.40–1.20)
GFR: 104.36 mL/min (ref >60.00)
Glucose, Bld: 99 mg/dL (ref 70–99)
Potassium: 4.3 meq/L (ref 3.5–5.1)
Sodium: 137 meq/L (ref 135–145)
Total Bilirubin: 0.5 mg/dL (ref 0.2–1.2)
Total Protein: 7.4 g/dL (ref 6.0–8.3)

## 2024-05-14 LAB — LIPID PANEL
Cholesterol: 211 mg/dL — ABNORMAL HIGH (ref 0–200)
HDL: 67 mg/dL (ref >39.00)
LDL Cholesterol: 112 mg/dL — ABNORMAL HIGH (ref 0–99)
NonHDL: 144.43
Total CHOL/HDL Ratio: 3
Triglycerides: 163 mg/dL — ABNORMAL HIGH (ref 0.0–149.0)
VLDL: 32.6 mg/dL (ref 0.0–40.0)

## 2024-05-14 LAB — TSH: TSH: 1.87 u[IU]/mL (ref 0.35–5.50)

## 2024-05-14 LAB — HEMOGLOBIN A1C: Hgb A1c MFr Bld: 5.9 % (ref 4.6–6.5)

## 2024-05-14 NOTE — Assessment & Plan Note (Signed)
 Chronic Lab Results  Component Value Date   HGBA1C 5.6 02/01/2022   Check a1c CBC, CMP, lipid, TSH Low sugar / carb diet Stressed regular exercise

## 2024-07-11 ENCOUNTER — Other Ambulatory Visit: Payer: Self-pay

## 2024-07-21 ENCOUNTER — Other Ambulatory Visit (HOSPITAL_COMMUNITY): Payer: Self-pay

## 2024-07-21 ENCOUNTER — Ambulatory Visit

## 2024-07-21 ENCOUNTER — Encounter

## 2024-07-21 VITALS — Ht 61.0 in | Wt 145.0 lb

## 2024-07-21 DIAGNOSIS — Z1211 Encounter for screening for malignant neoplasm of colon: Secondary | ICD-10-CM

## 2024-07-21 MED ORDER — NA SULFATE-K SULFATE-MG SULF 17.5-3.13-1.6 GM/177ML PO SOLN
1.0000 | Freq: Once | ORAL | 0 refills | Status: AC
  Filled 2024-07-21: qty 354, 2d supply, fill #0

## 2024-07-21 NOTE — Progress Notes (Signed)

## 2024-07-28 ENCOUNTER — Encounter: Payer: Self-pay | Admitting: Pediatrics

## 2024-08-01 NOTE — Progress Notes (Unsigned)
 [facility] History and Physical   Primary Care Physician:  Geofm Glade PARAS, MD   Reason for Procedure:  Colorectal cancer screening  Plan:    Colonoscopy   The patient was provided an opportunity to ask questions and all were answered. The patient agreed with the plan.   HPI: Breanna Gonzalez is a 43 y.o. female undergoing colonoscopy for colorectal cancer screening.  This is the patient's first colonoscopy.  There is a reported family history of polyps in the patient's brother.  No family history of colorectal cancer.  Patient denies current symptoms of rectal bleeding or change in bowel habits.   Past Medical History:  Diagnosis Date   ALLERGIC RHINITIS    Allergy     History of chicken pox    Perennial allergic rhinitis with seasonal variation 11/17/2011   Allergy  skin testing positive 11/13/11    Spina bifida occulta    Urticaria     Past Surgical History:  Procedure Laterality Date   SPINAL FIXATION SURGERY  2002   [facility] for spina bifida repair    Prior to Admission medications   Medication Sig Start Date End Date Taking? Authorizing Provider  etonogestrel -ethinyl estradiol  (NUVARING) 0.12-0.015 MG/24HR vaginal ring Insert 1 vaginal ring every month by vaginal route. 03/22/24     tretinoin  (RETIN-A ) 0.05 % cream Apply one application to affected area in the evening to face once a day. 05/06/22     Triamcinolone Acetonide (NASACORT ALLERGY  24HR) 55 MCG/ACT AERO Place into the nose daily.    [provider]    Current Outpatient Medications  Medication Sig Dispense Refill   etonogestrel -ethinyl estradiol  (NUVARING) 0.12-0.015 MG/24HR vaginal ring Insert 1 vaginal ring every month by vaginal route. 3 each 3   tretinoin  (RETIN-A ) 0.05 % cream Apply one application to affected area in the evening to face once a day. 20 g 3   Triamcinolone Acetonide (NASACORT ALLERGY  24HR) 55 MCG/ACT AERO Place into the nose daily.     No current facility-administered  medications for this visit.    Allergies as of 08/02/2024   (No Known Allergies)    Family History  Problem Relation Age of Onset   Diabetes Mother    Hypertension Mother    Asthma Mother        undx   Depression Mother    Colon polyps Brother    Diabetes Maternal Grandmother    Colon cancer Neg Hx    Esophageal cancer Neg Hx    Rectal cancer Neg Hx    Stomach cancer Neg Hx     Social History   Socioeconomic History   Marital status: Married    Spouse name: Not on file   Number of children: 0   Years of education: Not on file   Highest education level: Not on file  Occupational History   Occupation: NP [employer]  Tobacco Use   Smoking status: Never   Smokeless tobacco: Never  Vaping Use   Vaping status: Never Used  Substance and Sexual Activity   Alcohol use: Yes    Alcohol/week: 0.0 standard drinks of alcohol    Comment: occasional    Drug use: No   Sexual activity: Not on file  Other Topics Concern   Not on file  Social History Narrative   Exercises regularly - running 3-4 days a week 4-6 miles each time   Social Drivers of Corporate Investment Banker Strain: Not on file  Food Insecurity: Not on file  Transportation Needs: Not on file  Physical Activity: Not on file  Stress: Not on file  Social Connections: Not on file  Intimate Partner Violence: Not on file    Review of Systems:  All other review of systems negative except as mentioned in the HPI.  Physical Exam: Vital signs There were no vitals taken for this visit.  General:   Alert,  Well-developed, well-nourished, pleasant and cooperative in NAD Airway:  Mallampati  Lungs:  Clear throughout to auscultation.   Heart:  Regular rate and rhythm; no murmurs, clicks, rubs,  or gallops. Abdomen:  Soft, nontender and nondistended. Normal bowel sounds.   Neuro/Psych:  Normal mood and affect. A and O x 3  Inocente Hausen, MD [facility]

## 2024-08-02 ENCOUNTER — Encounter: Payer: Self-pay | Admitting: Pediatrics

## 2024-08-02 ENCOUNTER — Ambulatory Visit (AMBULATORY_SURGERY_CENTER): Admitting: Pediatrics

## 2024-08-02 VITALS — BP 137/69 | HR 71 | Temp 98.4°F | Resp 12 | Ht 61.75 in | Wt 145.0 lb

## 2024-08-02 DIAGNOSIS — D125 Benign neoplasm of sigmoid colon: Secondary | ICD-10-CM | POA: Diagnosis not present

## 2024-08-02 DIAGNOSIS — D128 Benign neoplasm of rectum: Secondary | ICD-10-CM

## 2024-08-02 DIAGNOSIS — K5730 Diverticulosis of large intestine without perforation or abscess without bleeding: Secondary | ICD-10-CM | POA: Diagnosis not present

## 2024-08-02 DIAGNOSIS — K6289 Other specified diseases of anus and rectum: Secondary | ICD-10-CM | POA: Diagnosis not present

## 2024-08-02 DIAGNOSIS — Z1211 Encounter for screening for malignant neoplasm of colon: Secondary | ICD-10-CM

## 2024-08-02 DIAGNOSIS — Z83719 Family history of colon polyps, unspecified: Secondary | ICD-10-CM | POA: Diagnosis not present

## 2024-08-02 DIAGNOSIS — D127 Benign neoplasm of rectosigmoid junction: Secondary | ICD-10-CM | POA: Diagnosis not present

## 2024-08-02 MED ORDER — SODIUM CHLORIDE 0.9 % IV SOLN: 500.0000 mL | Freq: Once | INTRAVENOUS | Status: DC

## 2024-08-02 NOTE — Progress Notes (Signed)
 Called to room to assist during endoscopic procedure.  Patient ID and intended procedure confirmed with present staff. Received instructions for my participation in the procedure from the performing physician.

## 2024-08-02 NOTE — Patient Instructions (Signed)
 YOU HAD AN ENDOSCOPIC PROCEDURE TODAY AT THE [facility]:   Refer to the procedure report that was given to you for any specific questions about what was found during the examination.  If the procedure report does not answer your questions, please call your gastroenterologist to clarify.  If you requested that your care partner not be given the details of your procedure findings, then the procedure report has been included in a sealed envelope for you to review at your convenience later.  YOU SHOULD EXPECT: Some feelings of bloating in the abdomen. Passage of more gas than usual.  Walking can help get rid of the air that was put into your GI tract during the procedure and reduce the bloating. If you had a lower endoscopy (such as a colonoscopy or flexible sigmoidoscopy) you may notice spotting of blood in your stool or on the toilet paper. If you underwent a bowel prep for your procedure, you may not have a normal bowel movement for a few days.  Please Note:  You might notice some irritation and congestion in your nose or some drainage.  This is from the oxygen used during your procedure.  There is no need for concern and it should clear up in a day or so.  SYMPTOMS TO REPORT IMMEDIATELY:  Following lower endoscopy (colonoscopy or flexible sigmoidoscopy):  Excessive amounts of blood in the stool  Significant tenderness or worsening of abdominal pains  Swelling of the abdomen that is new, acute  Fever of 100F or higher  Discharge patient to home Await pathology results Return to referring physician Handout on polyps given   For urgent or emergent issues, a gastroenterologist can be reached at any hour by calling [phone]. Do not use MyChart messaging for urgent concerns.    DIET:  We do recommend a small meal at first, but then you may proceed to your regular diet.  Drink plenty of fluids but you should avoid alcoholic beverages for 24 hours.  ACTIVITY:  You should plan  to take it easy for the rest of today and you should NOT DRIVE or use heavy machinery until tomorrow (because of the sedation medicines used during the test).    FOLLOW UP: Our staff will call the number listed on your records the next business day following your procedure.  We will call around 7:15- 8:00 am to check on you and address any questions or concerns that you may have regarding the information given to you following your procedure. If we do not reach you, we will leave a message.     If any biopsies were taken you will be contacted by phone or by letter within the next 1-3 weeks.  Please call us  at [phone] if you have not heard about the biopsies in 3 weeks.    SIGNATURES/CONFIDENTIALITY: You and/or your care partner have signed paperwork which will be entered into your electronic medical record.  These signatures attest to the fact that that the information above on your After Visit Summary has been reviewed and is understood.  Full responsibility of the confidentiality of this discharge information lies with you and/or your care-partner.

## 2024-08-02 NOTE — Progress Notes (Signed)
 Transferred to PACU via stretcher.  Not responding to stimulation at this time.  VSS upon leaving procedure room.

## 2024-08-02 NOTE — Progress Notes (Signed)
Pt's states no medical or surgical changes since previsit or office visit. VS by CW. 

## 2024-08-02 NOTE — Op Note (Signed)
 [facility] Patient Name: Breanna Gonzalez Procedure Date: 08/02/2024 7:30 AM MRN: [id number] Endoscopist: Inocente Hausen , MD, [id number] Age: 43 Referring MD:  Date of Birth: 01-17-1981 Gender: Female Account #: [id number] Procedure:                Colonoscopy Indications:              Colon cancer screening in patient at increased                            risk: Family history of 1st-degree relative with                            colon polyps before age 17 years, This is the                            patient's first colonoscopy Medicines:                Monitored Anesthesia Care Procedure:                Pre-Anesthesia Assessment:                           - Prior to the procedure, a History and Physical                            was performed, and patient medications and                            allergies were reviewed. The patient's tolerance of                            previous anesthesia was also reviewed. The risks                            and benefits of the procedure and the sedation                            options and risks were discussed with the patient.                            All questions were answered, and informed consent                            was obtained. Prior Anticoagulants: The patient has                            taken no anticoagulant or antiplatelet agents. ASA                            Grade Assessment: I - A normal, healthy patient.                            After reviewing the risks and benefits, the patient  was deemed in satisfactory condition to undergo the                            procedure.                           After obtaining informed consent, the colonoscope                            was passed under direct vision. Throughout the                            procedure, the patient's blood pressure, pulse, and                            oxygen saturations were monitored continuously. The                             Olympus Scope [id number] was introduced through the                            anus and advanced to the cecum, identified by                            appendiceal orifice and ileocecal valve. The                            colonoscopy was performed without difficulty. The                            patient tolerated the procedure well. The quality                            of the bowel preparation was good. The ileocecal                            valve, appendiceal orifice, and rectum were                            photographed. Scope In: 8:07:46 AM Scope Out: 8:31:21 AM Scope Withdrawal Time: 0 hours 19 minutes 57 seconds  Total Procedure Duration: 0 hours 23 minutes 35 seconds  Findings:                 The perianal and digital rectal examinations were                            normal. Pertinent negatives include normal                            sphincter tone and no palpable rectal lesions.                           A few small-mouthed diverticula were found in the  sigmoid colon and descending colon.                           A 4 mm polyp was found in the sigmoid colon. The                            polyp was sessile. The polyp was removed with a                            cold biopsy forceps. Resection and retrieval were                            complete.                           A 6 mm polyp was found in the rectum. The polyp was                            sessile. The polyp was removed with a cold snare.                            Resection and retrieval were complete.                           The retroflexed view of the distal rectum and anal                            verge was normal and showed no anal or rectal                            abnormalities.                           Anal papilla(e) were hypertrophied. Complications:            No immediate complications. Estimated blood loss:                             Minimal. Estimated Blood Loss:     Estimated blood loss was minimal. Impression:               - Diverticulosis in the sigmoid colon and in the                            descending colon.                           - One 4 mm polyp in the sigmoid colon, removed with                            a cold biopsy forceps. Resected and retrieved.                           - One 6 mm polyp in the rectum, removed with a cold  snare. Resected and retrieved.                           - Anal papilla(e) were hypertrophied. Recommendation:           - Discharge patient to home (ambulatory).                           - Await pathology results.                           - Repeat colonoscopy for surveillance based on                            pathology results.                           - Return to referring physician.                           - Patient has a contact number available for                            emergencies. The signs and symptoms of potential                            delayed complications were discussed with the                            patient. Return to normal activities tomorrow.                            Written discharge instructions were provided to the                            patient. Inocente Hausen, MD 08/02/2024 8:38:46 AM This report has been signed electronically.

## 2024-08-03 ENCOUNTER — Telehealth: Payer: Self-pay

## 2024-08-03 NOTE — Telephone Encounter (Signed)
  Follow up Call-     08/02/2024    7:16 AM  Call back number  Post procedure Call Back phone  # [phone]  Permission to leave phone message Yes     Patient questions:  Do you have a fever, pain , or abdominal swelling? No. Pain Score  0 *  Have you tolerated food without any problems? Yes.    Have you been able to return to your normal activities? Yes.    Do you have any questions about your discharge instructions: Diet   No. Medications  No. Follow up visit  No.  Do you have questions or concerns about your Care? No.  Actions: * If pain score is 4 or above: No action needed, pain <4.

## 2024-08-04 LAB — SURGICAL PATHOLOGY

## 2024-08-05 ENCOUNTER — Ambulatory Visit: Payer: Self-pay | Admitting: Pediatrics

## 2024-08-20 ENCOUNTER — Other Ambulatory Visit (HOSPITAL_BASED_OUTPATIENT_CLINIC_OR_DEPARTMENT_OTHER): Payer: Self-pay

## 2024-10-04 ENCOUNTER — Other Ambulatory Visit (HOSPITAL_COMMUNITY): Payer: Self-pay

## 2025-05-16 ENCOUNTER — Encounter: Admitting: Internal Medicine
